# Patient Record
Sex: Female | Born: 1935 | Race: White | Hispanic: No | Smoking: Never smoker
Health system: Southern US, Community
[De-identification: ages and names within clinical notes are randomized; demographics above are authoritative.]

## PROBLEM LIST (undated history)

## (undated) DIAGNOSIS — N179 Acute kidney failure, unspecified: Secondary | ICD-10-CM

## (undated) DIAGNOSIS — J449 Chronic obstructive pulmonary disease, unspecified: Secondary | ICD-10-CM

## (undated) DIAGNOSIS — I509 Heart failure, unspecified: Secondary | ICD-10-CM

## (undated) DIAGNOSIS — I10 Essential (primary) hypertension: Secondary | ICD-10-CM

## (undated) DIAGNOSIS — M1990 Unspecified osteoarthritis, unspecified site: Secondary | ICD-10-CM

---

## 2019-06-26 ENCOUNTER — Encounter (HOSPITAL_COMMUNITY): Payer: Self-pay

## 2019-06-26 ENCOUNTER — Emergency Department (HOSPITAL_COMMUNITY): Payer: MEDICARE

## 2019-06-26 ENCOUNTER — Emergency Department (HOSPITAL_COMMUNITY)
Admission: EM | Admit: 2019-06-26 | Discharge: 2019-06-26 | Disposition: A | Discharge status: Home / Self Care | Payer: MEDICARE | Source: Home / Self Care | Attending: Emergency Medicine | Admitting: Emergency Medicine

## 2019-06-26 ENCOUNTER — Other Ambulatory Visit: Payer: Self-pay

## 2019-06-26 DIAGNOSIS — S0181XA Laceration without foreign body of other part of head, initial encounter: Secondary | ICD-10-CM

## 2019-06-26 DIAGNOSIS — Z23 Encounter for immunization: Secondary | ICD-10-CM | POA: Insufficient documentation

## 2019-06-26 DIAGNOSIS — Y939 Activity, unspecified: Secondary | ICD-10-CM | POA: Insufficient documentation

## 2019-06-26 DIAGNOSIS — S0990XA Unspecified injury of head, initial encounter: Secondary | ICD-10-CM | POA: Insufficient documentation

## 2019-06-26 DIAGNOSIS — J449 Chronic obstructive pulmonary disease, unspecified: Secondary | ICD-10-CM | POA: Insufficient documentation

## 2019-06-26 DIAGNOSIS — I110 Hypertensive heart disease with heart failure: Secondary | ICD-10-CM | POA: Insufficient documentation

## 2019-06-26 DIAGNOSIS — W010XXA Fall on same level from slipping, tripping and stumbling without subsequent striking against object, initial encounter: Secondary | ICD-10-CM | POA: Insufficient documentation

## 2019-06-26 DIAGNOSIS — Y999 Unspecified external cause status: Secondary | ICD-10-CM | POA: Insufficient documentation

## 2019-06-26 DIAGNOSIS — Y929 Unspecified place or not applicable: Secondary | ICD-10-CM | POA: Insufficient documentation

## 2019-06-26 DIAGNOSIS — I509 Heart failure, unspecified: Secondary | ICD-10-CM | POA: Insufficient documentation

## 2019-06-26 HISTORY — DX: Unspecified osteoarthritis, unspecified site: M19.90

## 2019-06-26 HISTORY — DX: Heart failure, unspecified: I50.9

## 2019-06-26 HISTORY — DX: Essential (primary) hypertension: I10

## 2019-06-26 HISTORY — DX: Chronic obstructive pulmonary disease, unspecified: J44.9

## 2019-06-26 MED ORDER — TETANUS-DIPHTH-ACELL PERTUSSIS 5-2.5-18.5 LF-MCG/0.5 IM SUSP
0.5000 mL | Freq: Once | INTRAMUSCULAR | Status: AC
  Administered 2019-06-26: 03:00:00 0.5 mL via INTRAMUSCULAR
  Filled 2019-06-26: qty 0.5

## 2019-06-26 MED ORDER — LIDOCAINE-EPINEPHRINE-TETRACAINE (LET) TOPICAL GEL
3.0000 mL | Freq: Once | TOPICAL | Status: AC
  Administered 2019-06-26: 3 mL via TOPICAL
  Filled 2019-06-26: qty 3

## 2019-06-26 NOTE — ED Triage Notes (Signed)
Per EMS, she walking and tripped and hit face on the floor. Per EMS, she denied loss of consciousness. Patient has no c/o pain tenderness to neck or back. Per EMS, she ambulated on seen with her walker. Per EMS she has large laceration and hematoma above left eye.

## 2019-06-26 NOTE — ED Notes (Signed)
Called patient's daughter, Nyoka Cowden, to pick up patient for discharge.

## 2019-06-26 NOTE — ED Provider Notes (Signed)
Emergency Department Provider Note   I have reviewed the triage vital signs and the nursing notes.   HISTORY  Chief Complaint Fall   HPI Leslie Sellers is a 85 y.o. female who presents the emergency department today after a fall.  Patient states that she tripped over something and hit her forehead on the floor and suffered a laceration above her left eyebrow.  No pain elsewhere.  She is able ambulate without difficulty with EMS.  No loss of consciousness.  no severe headache.   No injuries elsewhere.unknown tetanus. Thinks she is on blood thinners but not sure which one or for what reason.   No other associated or modifying symptoms.    Past Medical History:  Diagnosis Date  . Arthritis   . CHF (congestive heart failure) (HCC)   . COPD (chronic obstructive pulmonary disease) (HCC)   . Hypertension     There are no problems to display for this patient.   Allergies Patient has no allergy information on record.  No family history on file.  Social History Social History   Tobacco Use  . Smoking status: Never Smoker  . Smokeless tobacco: Never Used  Substance Use Topics  . Alcohol use: Never  . Drug use: Never    Review of Systems  All other systems negative except as documented in the HPI. All pertinent positives and negatives as reviewed in the HPI. ____________________________________________   PHYSICAL EXAM:  VITAL SIGNS: ED Triage Vitals  Enc Vitals Group     BP 06/26/19 0142 (!) 163/56     Pulse Rate 06/26/19 0128 64     Resp 06/26/19 0128 (!) 21     Temp 06/26/19 0128 (!) 97.5 F (36.4 C)     Temp Source 06/26/19 0128 Oral     SpO2 06/26/19 0122 96 %     Weight 06/26/19 0144 110 lb (49.9 kg)     Height 06/26/19 0144 5\' 2"  (1.575 m)    Constitutional: Alert and oriented. Well appearing and in no acute distress. Eyes: Conjunctivae are normal. PERRL. EOMI. Head: avulsion/laceration above left eyebrow with associated hematoma Nose: No  congestion/rhinnorhea. Mouth/Throat: Mucous membranes are moist.  Oropharynx non-erythematous. Neck: No stridor.  No meningeal signs.   Cardiovascular: Normal rate, regular rhythm. Good peripheral circulation. Grossly normal heart sounds.   Respiratory: Normal respiratory effort.  No retractions. Lungs CTAB. Gastrointestinal: Soft and nontender. No distention.  Musculoskeletal: No lower extremity tenderness nor edema. No gross deformities of extremities. Neurologic:  Normal speech and language. No gross focal neurologic deficits are appreciated.  Skin:  Skin is warm, dry and intact. No rash noted.   ____________________________________________   RADIOLOGY  CT Head Wo Contrast  Result Date: 06/26/2019 CLINICAL DATA:  Head trauma headache EXAM: CT HEAD WITHOUT CONTRAST TECHNIQUE: Contiguous axial images were obtained from the base of the skull through the vertex without intravenous contrast. COMPARISON:  None. FINDINGS: Brain: No acute territorial infarction, hemorrhage, or intracranial mass. Moderate atrophy. Moderate to marked hypodensity in the white matter consistent with chronic small vessel ischemic change. Slightly enlarged ventricles felt secondary to atrophy. Vascular: No hyperdense vessels.  Carotid vascular calcification Skull: Normal. Negative for fracture or focal lesion. Sinuses/Orbits: Moderate mucosal thickening in the left maxillary sinus Other: Moderate forehead hematoma IMPRESSION: 1. No CT evidence for acute intracranial abnormality. 2. Atrophy and chronic small vessel ischemic changes of the white matter 3. Moderate forehead scalp hematoma Electronically Signed   By: 06/28/2019 M.D.   On:  06/26/2019 03:37    ____________________________________________   PROCEDURES  Procedure(s) performed:   Marland KitchenMarland KitchenLaceration Repair  Date/Time: 06/26/2019 11:42 PM Performed by: Merrily Pew, MD Authorized by: Merrily Pew, MD   Consent:    Consent obtained:  Verbal   Consent  given by:  Patient   Risks discussed:  Infection, need for additional repair, nerve damage, poor wound healing, poor cosmetic result, pain, retained foreign body, tendon damage and vascular damage   Alternatives discussed:  No treatment, delayed treatment and observation Anesthesia (see MAR for exact dosages):    Anesthesia method:  Local infiltration and topical application   Topical anesthetic:  LET Laceration details:    Location:  Face   Face location:  Forehead   Length (cm):  3   Depth (mm):  5 Repair type:    Repair type:  Simple Pre-procedure details:    Preparation:  Patient was prepped and draped in usual sterile fashion and imaging obtained to evaluate for foreign bodies Exploration:    Wound exploration: wound explored through full range of motion and entire depth of wound probed and visualized   Treatment:    Area cleansed with:  Saline   Amount of cleaning:  Extensive   Irrigation solution:  Sterile water   Irrigation volume:  100   Irrigation method:  Syringe   Visualized foreign bodies/material removed: no   Skin repair:    Repair method:  Sutures   Suture size:  4-0   Suture material:  Fast-absorbing gut   Suture technique:  Simple interrupted   Number of sutures:  3 Approximation:    Approximation:  Loose Post-procedure details:    Patient tolerance of procedure:  Tolerated well, no immediate complications Comments:     Not able to fully approximate the wound without significant deformity of eyebrow secondary to avulsion of some tissue.    ____________________________________________   INITIAL IMPRESSION / ASSESSMENT AND PLAN / ED COURSE  mehcabnical fall, will ct.  tdap to be updated. Will attempt wound repair but not sure she doesn't have some missing tissue.  Wound not able to be fully approximated because of missing flesh but improved somewhat. Absorbable sutures placed. Dc.      Pertinent labs & imaging results that were available during my  care of the patient were reviewed by me and considered in my medical decision making (see chart for details).  A medical screening exam was performed and I feel the patient has had an appropriate workup for their chief complaint at this time and likelihood of emergent condition existing is low. They have been counseled on decision, discharge, follow up and which symptoms necessitate immediate return to the emergency department. They or their family verbally stated understanding and agreement with plan and discharged in stable condition.   ____________________________________________  FINAL CLINICAL IMPRESSION(S) / ED DIAGNOSES  Final diagnoses:  Laceration of forehead, initial encounter     MEDICATIONS GIVEN DURING THIS VISIT:  Medications  Tdap (BOOSTRIX) injection 0.5 mL (0.5 mLs Intramuscular Given 06/26/19 0249)  lidocaine-EPINEPHrine-tetracaine (LET) topical gel (3 mLs Topical Given 06/26/19 0253)     NEW OUTPATIENT MEDICATIONS STARTED DURING THIS VISIT:  There are no discharge medications for this patient.   Note:  This note was prepared with assistance of Dragon voice recognition software. Occasional wrong-word or sound-a-like substitutions may have occurred due to the inherent limitations of voice recognition software.   Merrily Pew, MD 06/26/19 [phone]

## 2019-06-26 NOTE — Discharge Instructions (Addendum)
Your wound appeared to have some avulsed skin and thus I was not able to close it as close as I would have liked.  Secondary to the swelling and the missing skin of I had closed it all the way you would had a permanent deformity to your left eyebrow.  I was able to repair to a certain extent which should help some with cosmesis.  However it will take a little bit longer for it to heal secondary to my inability to fully repair it.  The sutures should come out in 7 to 10 days if not you may return here to get them removed.  If you do not like the appearance of the wound after it heals in 6 to 12 months you can see a plastic surgeon for consideration of revision.

## 2019-06-26 NOTE — ED Notes (Signed)
Patient's wound starting bleeding again. Cleansed wound with NS and dressing with nonadhesive dressing, gauze and kerlix.

## 2019-06-27 ENCOUNTER — Inpatient Hospital Stay (HOSPITAL_COMMUNITY)
Admission: EM | Admit: 2019-06-27 | Discharge: 2019-06-29 | DRG: 392 | Disposition: A | Discharge status: Home Health | Payer: MEDICARE | Attending: Family Medicine | Admitting: Family Medicine

## 2019-06-27 ENCOUNTER — Encounter (HOSPITAL_COMMUNITY): Payer: Self-pay | Admitting: Emergency Medicine

## 2019-06-27 ENCOUNTER — Emergency Department (HOSPITAL_COMMUNITY): Payer: MEDICARE

## 2019-06-27 ENCOUNTER — Other Ambulatory Visit: Payer: Self-pay

## 2019-06-27 DIAGNOSIS — Y92009 Unspecified place in unspecified non-institutional (private) residence as the place of occurrence of the external cause: Secondary | ICD-10-CM

## 2019-06-27 DIAGNOSIS — E860 Dehydration: Secondary | ICD-10-CM

## 2019-06-27 DIAGNOSIS — Z23 Encounter for immunization: Secondary | ICD-10-CM

## 2019-06-27 DIAGNOSIS — Z20822 Contact with and (suspected) exposure to covid-19: Secondary | ICD-10-CM | POA: Diagnosis present

## 2019-06-27 DIAGNOSIS — N189 Chronic kidney disease, unspecified: Secondary | ICD-10-CM | POA: Diagnosis present

## 2019-06-27 DIAGNOSIS — S8001XA Contusion of right knee, initial encounter: Secondary | ICD-10-CM | POA: Diagnosis present

## 2019-06-27 DIAGNOSIS — Z9181 History of falling: Secondary | ICD-10-CM

## 2019-06-27 DIAGNOSIS — I2510 Atherosclerotic heart disease of native coronary artery without angina pectoris: Secondary | ICD-10-CM | POA: Diagnosis present

## 2019-06-27 DIAGNOSIS — K828 Other specified diseases of gallbladder: Secondary | ICD-10-CM | POA: Diagnosis present

## 2019-06-27 DIAGNOSIS — K219 Gastro-esophageal reflux disease without esophagitis: Secondary | ICD-10-CM | POA: Diagnosis present

## 2019-06-27 DIAGNOSIS — A09 Infectious gastroenteritis and colitis, unspecified: Secondary | ICD-10-CM | POA: Diagnosis not present

## 2019-06-27 DIAGNOSIS — I130 Hypertensive heart and chronic kidney disease with heart failure and stage 1 through stage 4 chronic kidney disease, or unspecified chronic kidney disease: Secondary | ICD-10-CM | POA: Diagnosis present

## 2019-06-27 DIAGNOSIS — Z7902 Long term (current) use of antithrombotics/antiplatelets: Secondary | ICD-10-CM

## 2019-06-27 DIAGNOSIS — N179 Acute kidney failure, unspecified: Secondary | ICD-10-CM

## 2019-06-27 DIAGNOSIS — Z79899 Other long term (current) drug therapy: Secondary | ICD-10-CM

## 2019-06-27 DIAGNOSIS — I509 Heart failure, unspecified: Secondary | ICD-10-CM | POA: Diagnosis present

## 2019-06-27 DIAGNOSIS — E872 Acidosis: Secondary | ICD-10-CM | POA: Diagnosis present

## 2019-06-27 DIAGNOSIS — M1711 Unilateral primary osteoarthritis, right knee: Secondary | ICD-10-CM | POA: Diagnosis present

## 2019-06-27 DIAGNOSIS — K529 Noninfective gastroenteritis and colitis, unspecified: Secondary | ICD-10-CM | POA: Diagnosis present

## 2019-06-27 DIAGNOSIS — N83201 Unspecified ovarian cyst, right side: Secondary | ICD-10-CM | POA: Diagnosis present

## 2019-06-27 DIAGNOSIS — F329 Major depressive disorder, single episode, unspecified: Secondary | ICD-10-CM | POA: Diagnosis present

## 2019-06-27 DIAGNOSIS — I745 Embolism and thrombosis of iliac artery: Secondary | ICD-10-CM | POA: Diagnosis present

## 2019-06-27 DIAGNOSIS — Z87891 Personal history of nicotine dependence: Secondary | ICD-10-CM

## 2019-06-27 DIAGNOSIS — S0181XA Laceration without foreign body of other part of head, initial encounter: Secondary | ICD-10-CM | POA: Diagnosis present

## 2019-06-27 DIAGNOSIS — W19XXXA Unspecified fall, initial encounter: Secondary | ICD-10-CM

## 2019-06-27 DIAGNOSIS — W010XXA Fall on same level from slipping, tripping and stumbling without subsequent striking against object, initial encounter: Secondary | ICD-10-CM | POA: Diagnosis present

## 2019-06-27 DIAGNOSIS — E785 Hyperlipidemia, unspecified: Secondary | ICD-10-CM | POA: Diagnosis present

## 2019-06-27 DIAGNOSIS — J449 Chronic obstructive pulmonary disease, unspecified: Secondary | ICD-10-CM | POA: Diagnosis present

## 2019-06-27 HISTORY — DX: Acute kidney failure, unspecified: N17.9

## 2019-06-27 LAB — CBC WITH DIFFERENTIAL/PLATELET
Abs Immature Granulocytes: 0.09 10*3/uL — ABNORMAL HIGH (ref 0.00–0.07)
Basophils Absolute: 0 10*3/uL (ref 0.0–0.1)
Basophils Relative: 0 %
Eosinophils Absolute: 0.1 10*3/uL (ref 0.0–0.5)
Eosinophils Relative: 1 %
HCT: 36.1 % (ref 36.0–46.0)
Hemoglobin: 11.4 g/dL — ABNORMAL LOW (ref 12.0–15.0)
Immature Granulocytes: 1 %
Lymphocytes Relative: 9 %
Lymphs Abs: 1 10*3/uL (ref 0.7–4.0)
MCH: 31.1 pg (ref 26.0–34.0)
MCHC: 31.6 g/dL (ref 30.0–36.0)
MCV: 98.4 fL (ref 80.0–100.0)
Monocytes Absolute: 0.7 10*3/uL (ref 0.1–1.0)
Monocytes Relative: 6 %
Neutro Abs: 8.5 10*3/uL — ABNORMAL HIGH (ref 1.7–7.7)
Neutrophils Relative %: 83 %
Platelets: 216 10*3/uL (ref 150–400)
RBC: 3.67 MIL/uL — ABNORMAL LOW (ref 3.87–5.11)
RDW: 13.6 % (ref 11.5–15.5)
WBC: 10.3 10*3/uL (ref 4.0–10.5)
nRBC: 0 % (ref 0.0–0.2)

## 2019-06-27 LAB — I-STAT CHEM 8, ED
BUN: 30 mg/dL — ABNORMAL HIGH (ref 8–23)
Calcium, Ion: 1.03 mmol/L — ABNORMAL LOW (ref 1.15–1.40)
Chloride: 108 mmol/L (ref 98–111)
Creatinine, Ser: 1.6 mg/dL — ABNORMAL HIGH (ref 0.44–1.00)
Glucose, Bld: 128 mg/dL — ABNORMAL HIGH (ref 70–99)
HCT: 35 % — ABNORMAL LOW (ref 36.0–46.0)
Hemoglobin: 11.9 g/dL — ABNORMAL LOW (ref 12.0–15.0)
Potassium: 4.3 mmol/L (ref 3.5–5.1)
Sodium: 142 mmol/L (ref 135–145)
TCO2: 24 mmol/L (ref 22–32)

## 2019-06-27 LAB — COMPREHENSIVE METABOLIC PANEL
ALT: 12 U/L (ref 0–44)
AST: 19 U/L (ref 15–41)
Albumin: 3.4 g/dL — ABNORMAL LOW (ref 3.5–5.0)
Alkaline Phosphatase: 60 U/L (ref 38–126)
Anion gap: 14 (ref 5–15)
BUN: 28 mg/dL — ABNORMAL HIGH (ref 8–23)
CO2: 21 mmol/L — ABNORMAL LOW (ref 22–32)
Calcium: 8.3 mg/dL — ABNORMAL LOW (ref 8.9–10.3)
Chloride: 106 mmol/L (ref 98–111)
Creatinine, Ser: 1.55 mg/dL — ABNORMAL HIGH (ref 0.44–1.00)
GFR calc Af Amer: 34 mL/min — ABNORMAL LOW (ref >60)
GFR calc non Af Amer: 29 mL/min — ABNORMAL LOW (ref >60)
Glucose, Bld: 133 mg/dL — ABNORMAL HIGH (ref 70–99)
Potassium: 4.4 mmol/L (ref 3.5–5.1)
Sodium: 141 mmol/L (ref 135–145)
Total Bilirubin: 0.6 mg/dL (ref 0.3–1.2)
Total Protein: 6 g/dL — ABNORMAL LOW (ref 6.5–8.1)

## 2019-06-27 LAB — LACTIC ACID, PLASMA
Lactic Acid, Venous: 3.3 mmol/L (ref 0.5–1.9)
Lactic Acid, Venous: 0.8 mmol/L (ref 0.5–1.9)

## 2019-06-27 LAB — MAGNESIUM: Magnesium: 1.9 mg/dL (ref 1.7–2.4)

## 2019-06-27 LAB — POC SARS CORONAVIRUS 2 AG -  ED: SARS Coronavirus 2 Ag: NEGATIVE

## 2019-06-27 MED ORDER — LORAZEPAM 0.5 MG PO TABS
0.5000 mg | ORAL_TABLET | Freq: Two times a day (BID) | ORAL | Status: DC | PRN
  Administered 2019-06-28 (×3): 0.5 mg via ORAL
  Filled 2019-06-27 (×3): qty 1

## 2019-06-27 MED ORDER — PANTOPRAZOLE SODIUM 40 MG PO TBEC
40.0000 mg | DELAYED_RELEASE_TABLET | Freq: Every day | ORAL | Status: DC
  Administered 2019-06-28 (×2): 40 mg via ORAL
  Filled 2019-06-27 (×2): qty 1

## 2019-06-27 MED ORDER — ONDANSETRON HCL 4 MG/2ML IJ SOLN: 4.0000 mg | Freq: Four times a day (QID) | INTRAMUSCULAR | Status: DC | PRN

## 2019-06-27 MED ORDER — SODIUM CHLORIDE 0.9 % IV SOLN: INTRAVENOUS | Status: DC

## 2019-06-27 MED ORDER — CITALOPRAM HYDROBROMIDE 20 MG PO TABS
10.0000 mg | ORAL_TABLET | Freq: Every day | ORAL | Status: DC
  Administered 2019-06-28 – 2019-06-29 (×2): 10 mg via ORAL
  Filled 2019-06-27 (×2): qty 1

## 2019-06-27 MED ORDER — METRONIDAZOLE IN NACL 5-0.79 MG/ML-% IV SOLN
500.0000 mg | Freq: Three times a day (TID) | INTRAVENOUS | Status: DC
  Administered 2019-06-27 – 2019-06-28 (×2): 500 mg via INTRAVENOUS
  Filled 2019-06-27 (×2): qty 100

## 2019-06-27 MED ORDER — SODIUM CHLORIDE 0.9 % IV BOLUS
1000.0000 mL | Freq: Once | INTRAVENOUS | Status: AC
  Administered 2019-06-27: 19:00:00 1000 mL via INTRAVENOUS

## 2019-06-27 MED ORDER — BACITRACIN-NEOMYCIN-POLYMYXIN OINTMENT TUBE: 1.0000 "application " | TOPICAL_OINTMENT | Freq: Two times a day (BID) | CUTANEOUS | Status: DC | PRN

## 2019-06-27 MED ORDER — ENOXAPARIN SODIUM 30 MG/0.3ML ~~LOC~~ SOLN
30.0000 mg | Freq: Every day | SUBCUTANEOUS | Status: DC
  Administered 2019-06-28 – 2019-06-29 (×2): 30 mg via SUBCUTANEOUS
  Filled 2019-06-27 (×2): qty 0.3

## 2019-06-27 MED ORDER — CLOPIDOGREL BISULFATE 75 MG PO TABS
75.0000 mg | ORAL_TABLET | Freq: Every day | ORAL | Status: DC
  Administered 2019-06-28 – 2019-06-29 (×2): 75 mg via ORAL
  Filled 2019-06-27 (×2): qty 1

## 2019-06-27 MED ORDER — ONDANSETRON HCL 4 MG PO TABS: 4.0000 mg | ORAL_TABLET | Freq: Four times a day (QID) | ORAL | Status: DC | PRN

## 2019-06-27 MED ORDER — SODIUM CHLORIDE 0.9 % IV SOLN
2.0000 g | INTRAVENOUS | Status: DC
  Administered 2019-06-27: 22:00:00 2 g via INTRAVENOUS
  Filled 2019-06-27 (×2): qty 20

## 2019-06-27 MED ORDER — ACETAMINOPHEN 325 MG PO TABS
650.0000 mg | ORAL_TABLET | Freq: Four times a day (QID) | ORAL | Status: DC | PRN
  Administered 2019-06-28 – 2019-06-29 (×2): 650 mg via ORAL
  Filled 2019-06-27 (×2): qty 2

## 2019-06-27 MED ORDER — PRAVASTATIN SODIUM 40 MG PO TABS
40.0000 mg | ORAL_TABLET | Freq: Every day | ORAL | Status: DC
  Administered 2019-06-28 (×2): 40 mg via ORAL
  Filled 2019-06-27 (×2): qty 1

## 2019-06-27 NOTE — ED Provider Notes (Signed)
[facility] EMERGENCY DEPARTMENT Provider Note   CSN: [id number] Arrival date & time: 06/27/19  1652     History Chief Complaint  Patient presents with  . Diarrhea  . Hip Pain    Leslie Sellers is a 86 y.o. female.  HPI  86 year old female presents with vomiting and diarrhea.  She fell and was seen in the ER couple days ago.  Started having vomiting and diarrhea today or yesterday.  She denies fever.  Has had a little bit of a dry cough this morning.  No shortness of breath, chest pain.  She does have abdominal soreness/pain.  No blood in the emesis or diarrhea.  She denies being on antibiotics recently.  She wonders if this could be from having a tetanus immunization a couple days ago.  Patient also notes that her legs were numb and painful to move earlier though this seems to have resolved now.  No numbness or pain now.  Past Medical History:  Diagnosis Date  . Arthritis   . CHF (congestive heart failure) ([place])   . COPD (chronic obstructive pulmonary disease) ([place])   . Hypertension     There are no problems to display for this patient.   History reviewed. No pertinent surgical history.   OB History   No obstetric history on file.     No family history on file.  Social History   Tobacco Use  . Smoking status: Never Smoker  . Smokeless tobacco: Never Used  Substance Use Topics  . Alcohol use: Never  . Drug use: Never    Home Medications Prior to Admission medications   Not on File    Allergies    Patient has no allergy information on record.  Review of Systems   Review of Systems  Constitutional: Negative for fever.  Respiratory: Positive for cough. Negative for shortness of breath.   Cardiovascular: Negative for chest pain.  Gastrointestinal: Positive for abdominal pain, diarrhea and vomiting. Negative for blood in stool.  Neurological: Positive for light-headedness.  All other systems reviewed and are negative.   Physical  Exam Updated Vital Signs BP (!) 79/31   Pulse 62   Temp 98 F (36.7 C) (Oral)   Resp 20   SpO2 94%   Physical Exam Vitals and nursing note reviewed.  Constitutional:      General: She is not in acute distress.    Appearance: She is well-developed. She is not diaphoretic.  HENT:     Head: Normocephalic.     Comments: Multiple areas of ecchymosis to face    Right Ear: External ear normal.     Left Ear: External ear normal.     Nose: Nose normal.  Eyes:     General:        Right eye: No discharge.        Left eye: No discharge.  Cardiovascular:     Rate and Rhythm: Normal rate and regular rhythm.     Heart sounds: Normal heart sounds.  Pulmonary:     Effort: Pulmonary effort is normal.     Breath sounds: Normal breath sounds.  Abdominal:     Palpations: Abdomen is soft.     Tenderness: There is generalized abdominal tenderness (mild, nonfocal).  Musculoskeletal:     Right hip: No deformity or tenderness. Normal range of motion.     Left hip: No deformity or tenderness. Normal range of motion.  Skin:    General: Skin is warm and dry.  Neurological:     Mental Status: She is alert.  Psychiatric:        Mood and Affect: Mood is not anxious.     ED Results / Procedures / Treatments   Labs (all labs ordered are listed, but only abnormal results are displayed) Labs Reviewed  CULTURE, BLOOD (ROUTINE X 2)  CULTURE, BLOOD (ROUTINE X 2)  URINE CULTURE  C DIFFICILE QUICK SCREEN W PCR REFLEX  GI PATHOGEN PANEL BY PCR, STOOL  COMPREHENSIVE METABOLIC PANEL  CBC WITH DIFFERENTIAL/PLATELET  LACTIC ACID, PLASMA  LACTIC ACID, PLASMA  URINALYSIS, ROUTINE W REFLEX MICROSCOPIC  MAGNESIUM  I-STAT CHEM 8, ED  POC SARS CORONAVIRUS 2 AG -  ED    EKG None  Radiology CT Head Wo Contrast  Result Date: 06/26/2019 CLINICAL DATA:  Head trauma headache EXAM: CT HEAD WITHOUT CONTRAST TECHNIQUE: Contiguous axial images were obtained from the base of the skull through the vertex  without intravenous contrast. COMPARISON:  None. FINDINGS: Brain: No acute territorial infarction, hemorrhage, or intracranial mass. Moderate atrophy. Moderate to marked hypodensity in the white matter consistent with chronic small vessel ischemic change. Slightly enlarged ventricles felt secondary to atrophy. Vascular: No hyperdense vessels.  Carotid vascular calcification Skull: Normal. Negative for fracture or focal lesion. Sinuses/Orbits: Moderate mucosal thickening in the left maxillary sinus Other: Moderate forehead hematoma IMPRESSION: 1. No CT evidence for acute intracranial abnormality. 2. Atrophy and chronic small vessel ischemic changes of the white matter 3. Moderate forehead scalp hematoma Electronically Signed   By: Jasmine Pang M.D.   On: 06/26/2019 03:37    Procedures .Critical Care Performed by: Pricilla Loveless, MD Authorized by: Pricilla Loveless, MD   Critical care provider statement:    Critical care time (minutes):  35   Critical care time was exclusive of:  Separately billable procedures and treating other patients   Critical care was necessary to treat or prevent imminent or life-threatening deterioration of the following conditions:  Shock, sepsis and renal failure   Critical care was time spent personally by me on the following activities:  Discussions with consultants, evaluation of patient's response to treatment, examination of patient, ordering and performing treatments and interventions, ordering and review of laboratory studies, ordering and review of radiographic studies, pulse oximetry, re-evaluation of patient's condition, obtaining history from patient or surrogate and review of old charts   (including critical care time)  Medications Ordered in ED Medications  sodium chloride 0.9 % bolus 1,000 mL (has no administration in time range)    ED Course  I have reviewed the triage vital signs and the nursing notes.  Pertinent labs & imaging results that were  available during my care of the patient were reviewed by me and considered in my medical decision making (see chart for details).  Clinical Course as of Jun 27 1743  Fri Jun 27, 2019  1743 Patient's blood pressure is soft.  She is afebrile.  My suspicion is based on the volume of diarrhea reported by both nurse and patient that this is likely dehydration.  We will certainly look for infectious causes/sources but this seems less likely to be sepsis.  Hold off on antibiotics for now.   [SG]    Clinical Course User Index [SG] Pricilla Loveless, MD   MDM Rules/Calculators/A&P                      Patient's hypotension improved with IV fluids.  She does have a lactic acidosis, probably from  dehydration.  With the potentially infectious colitis, however, will give IV antibiotics.  Pharmacy recommending cephalosporin with Flagyl.  Will test for GI pathogens and C. difficile.  Will need admission for further fluids and supportive care. Final Clinical Impression(s) / ED Diagnoses Final diagnoses:  Acute kidney injury (HCC)  Infectious colitis    Rx / DC Orders ED Discharge Orders    None       Pricilla Loveless, MD 06/28/19 [phone]

## 2019-06-27 NOTE — H&P (Addendum)
Family Medicine Teaching [facility] Admission History and Physical Service Pager: [phone]  Patient name: Leslie Sellers Medical record number: [id number] Date of birth: Aug 28, 1925 Age: 86 y.o. Gender: female  Primary Care Provider: Marletta Lor, NP Consultants: None Code Status: FULL Preferred Emergency Contact: daughter Di Kindle at [phone]  Chief Complaint: Abdominal pain, diarrhea  Assessment and Plan: Leslie Sellers is a 86 y.o. female presenting with vomiting and diarrhea. PMH is significant for CHF, COPD, HTN, arthritis, depression.  Abdominal Pain  Diarrhea  Dehydration 86 year old lady presenting with vomiting and diarrhea for 1 day.  Covid negative, mild anemia to 11.9, white blood cell count within normal limits at 10.3, however ANC elevated to 8.5.  Of note lactic acid is 3.3.  Blood pressures were on the softer side at 80s/50s, in combination with volume of vomiting and diarrhea, concern for dehydration was high.  Patient resuscitated with 1L NS, started on CTX and flagyl in ED, blood cultures obtained.  Blood pressure normalized with IV hydration.  CT abdomen revealed gallbladder distention with wall thickening, mild diffuse wall thickening of transverse and descending colon indicating possible infectious or inflammatory colitis, extensive atherosclerotic changes indicating possible occlusion in bilateral common iliac arteries, sigmoid diverticulosis without diverticulitis, 3.7 right ovarian cystic mass.  Acute cholecystitis unlikely due to negative comfortable appearance and negative Murphy's sign.  Radiology recommended nonemergent outpatient CT of abdominal aorta, as well as follow-up with a nonemergent outpatient pelvic ultrasound in 6 months for the ovarian mass. -Admit to FPTS, MedSurg, attending Dr. Leveda Anna -AM CBC, BMP, LA -Follow-up blood culture -Continue CTX, Flagyl, may be able to stop abx if patient is well appearing tomorrow -NS @ mIVF -Follow-up C. difficile  PCR result and GI pathogen panel -Enteric precautions  Recent fall Evaluated in the [facility] ED on 12/31 for a fall in which she hit her head.  CT head was negative and she received sutures.  She says her legs felt "numb" and like they were asleep, which is intermittent and lead to her fall.  She is not currently having the pain in her right leg and back.  Patient's daughter says that she is supposed to use a walker, however when she fell on Wednesday, she was not using the walker.  Patient has had previous stints in inpatient rehab, most recently moved here 1 month ago from a nursing home in [place].  CT abdomen pelvis reveals likely bilateral common iliac arteries are occluded, recommend CTA on nonemergent outpatient basis. - PT/OT evaluation -Follow-up CTA for abdominal aortic occlusions  AKI vs CKD Creatinine is 1.60 today.  Unsure what patient's baseline kidney function is, but she does report having a stent placed in her kidney in the past.  She has had recent poor PO intake and significant GI loss, which would make an AKI due to prerenal cause likely. -BMP daily -Avoid nephrotoxic agents  CHF, likely CAD Patient recently moved here from [place], got patient's medical history from daughter, unknown EF.  Daughter reported history of multiple attempts at stenting heart arteries, however these attempts were "unsuccessful."  Home medications include Plavix 75 mg, Imdur 30 mg, Coreg 12.5 mg twice daily.  Continue Plavix, hold Imdur and Coreg for hypotension.  Patient does not appear fluid overloaded on exam, and CXR negative for cardiomegaly or pulmonary edema. -Watch for fluid overload -Continue Plavix  HTN Home medications include amlodipine 5, lisinopril 40 mg. -Hold home medications for hypotension  Polypharmacy Patient has a list of beers drugs that are inappropriate  for geriatric patient, including chlorpheniramine and Ativan.  Patient has a longstanding use of Ativan, daughter notes that  while living in [place] and on her own, patient would sometimes take more Ativan than is prescribed.  Daughter has been attempting to help wean her mother off Ativan.  Patient is very adamant that she receive her "nerve pills".  Current dose is 0.5 mg in the morning and 1 mg in the evening.  -Ativan 0.5 mg twice daily PRN, recommended to patient's daughter to continue weaning as able -Recommend discontinuing chlorpheniramine  HLD Home medications include pravastatin 40 mg daily -Continue home medications  GERD Medications include pantoprazole 40 mg daily -Continue pantoprazole  Depression Patient has a history of depression and home medications include Celexa. -Continue Celexa  COPD Daughter reports history of COPD.  Patient is not currently on any COPD medications.  She confirms she smoked for about a decade 30 years ago.  FEN/GI: Regular diet, pantoprazole Prophylaxis: Lovenox  Disposition: to med-surg pending further medical work up  History of Present Illness:  Leslie Sellers is a 86 y.o. female presenting with diarrhea and vomiting of one day's duration.  She has been unable to drink or eat anything at home over the past 36 hours due to her symptoms.  She denies sick contacts.  She reports having a half of a hamburger, orange slices, and a few cookies for lunch yesterday and says the hamburger was raw inside.  She says she got hot for awhile yesterday but denies measured fever.  She vomited twice and has had 11-12 episodes of watery diarrhea.  She says she has not urinated very much since her symptoms began.    Review Of Systems: Per HPI with the following additions:   Review of Systems  Constitutional: Negative for weight loss.  Respiratory: Positive for cough (dry). Negative for sputum production.   Cardiovascular: Negative for chest pain.    There are no problems to display for this patient.   Past Medical History: Past Medical History:  Diagnosis Date  . Arthritis   . CHF  (congestive heart failure) (HCC)   . COPD (chronic obstructive pulmonary disease) (HCC)   . Hypertension     Past Surgical History: History reviewed. No pertinent surgical history.  Social History: Social History   Tobacco Use  . Smoking status: Never Smoker  . Smokeless tobacco: Never Used  Substance Use Topics  . Alcohol use: Never  . Drug use: Never   Additional social history: Patient lives with her daughter, daughter's husband, and grandson.  She recently moved here from [place] about a month ago.  She was previously living in a nursing home.  She has a history of number of falls, but is otherwise generally healthy according to patient's daughter. Please also refer to relevant sections of EMR.  Family History: No family history on file.   Allergies and Medications: No Known Allergies No current facility-administered medications on file prior to encounter.   No current outpatient medications on file prior to encounter.    Objective: BP (!) 113/57   Pulse 67   Temp 98 F (36.7 C) (Oral)   Resp 11   SpO2 100%  Exam: General: Small, thin, elderly woman, pleasant, NAD, resting in bed Eyes: Anicteric sclerae ENTM: Moist mucous membranes Neck: Supple Cardiovascular: Regular rate and rhythm, no M/R/G Respiratory: CTAB, no increased work of breathing Gastrointestinal: Soft, mildly tender to palpation, nondistended, normoactive bowel sounds MSK: Ecchymosis present on left shin, right knee, left shoulder Derm:  Significant ecchymosis over both eyes and most of left forehead and face, healing lacerations on upper forehead of left scalp Neuro: No focal deficits Psych: Good mood, normal affect  Labs and Imaging: CBC BMET  Recent Labs  Lab 06/27/19 1805 06/27/19 1819  WBC 10.3  --   HGB 11.4* 11.9*  HCT 36.1 35.0*  PLT 216  --    Recent Labs  Lab 06/27/19 1805 06/27/19 1819  NA 141 142  K 4.4 4.3  CL 106 108  CO2 21*  --   BUN 28* 30*  CREATININE 1.55* 1.60*   GLUCOSE 133* 128*  CALCIUM 8.3*  --      EKG: ordered, EKG in room showed QT interval 422 WNL with sinus arrhythmia  CT ABDOMEN PELVIS WO CONTRAST  Result Date: 06/27/2019 CLINICAL DATA:  Nausea and vomiting. EXAM: CT ABDOMEN AND PELVIS WITHOUT CONTRAST TECHNIQUE: Multidetector CT imaging of the abdomen and pelvis was performed following the standard protocol without IV contrast. COMPARISON:  None. FINDINGS: Lower chest: There are emphysematous changes at the lung bases bilaterally with prominent pleuroparenchymal scarring and reticular airspace opacities.The heart size is normal. Hepatobiliary: The liver is normal. The gallbladder is distended. There is gallbladder wall thickening.There is no biliary ductal dilation. Pancreas: Normal contours without ductal dilatation. No peripancreatic fluid collection. Spleen: No splenic laceration or hematoma. Adrenals/Urinary Tract: --Adrenal glands: No adrenal hemorrhage. --Right kidney/ureter: No hydronephrosis or perinephric hematoma. --Left kidney/ureter: No hydronephrosis or perinephric hematoma. --Urinary bladder: The bladder is decompressed and therefore poorly evaluated Stomach/Bowel: --Stomach/Duodenum: There is a small hiatal hernia. --Small bowel: No dilatation or inflammation. --Colon: There is mild diffuse wall thickening involving the transverse colon and descending colon. There is sigmoid diverticulosis without CT evidence for diverticulitis. --Appendix: Normal. Vascular/Lymphatic: There are extensive atherosclerotic changes throughout the abdominal aorta. There may be significant stenosis involving the infrarenal abdominal aorta. An underlying occlusion cannot be excluded. There is likely high-grade stenosis if not occlusion of the bilateral common iliac arteries. --No retroperitoneal lymphadenopathy. --No mesenteric lymphadenopathy. --No pelvic or inguinal lymphadenopathy. Reproductive: There is a right ovarian cystic mass measuring approximately 3.7  cm in diameter. Other: No ascites or free air. The abdominal wall is normal. Musculoskeletal. No acute displaced fractures. IMPRESSION: 1. The gallbladder is distended and there is gallbladder wall thickening. If there is concern for acute cholecystitis, recommend further evaluation with right upper quadrant ultrasound. 2. Mild diffuse wall thickening of the transverse and descending colon may represent infectious or inflammatory colitis in the appropriate clinical setting. 3. Extensive atherosclerotic changes of the infrarenal abdominal aorta and bilateral common iliac arteries. An underlying stenosis of both is suspected and can be further evaluated with a nonemergent outpatient CT a as clinically indicated. 4. There is sigmoid diverticulosis without CT evidence for diverticulitis. 5. There is a 3.7 cm right ovarian cystic mass. Follow-up with a nonemergent outpatient pelvic ultrasound is recommended in 6 months. Aortic Atherosclerosis (ICD10-I70.0) and Emphysema (ICD10-J43.9). Electronically Signed   By: Katherine Mantle M.D.   On: 06/27/2019 19:42   DG Chest Portable 1 View  Result Date: 06/27/2019 CLINICAL DATA:  Cough and hypotension EXAM: PORTABLE CHEST 1 VIEW COMPARISON:  None. FINDINGS: Cardiac shadows within normal limits. The lungs are well aerated bilaterally. No focal infiltrate or sizable effusion is seen. Degenerative changes of left shoulder joint are noted. No acute bony abnormality is noted. IMPRESSION: No active disease. Electronically Signed   By: Alcide Clever M.D.   On: 06/27/2019 18:25    Mahoney,  Urban Gibson, MD 06/27/2019, 9:30 PM PGY-1, [place] Intern pager: [phone], text pages welcome  FPTS Upper-Level Resident Addendum   I have independently interviewed and examined the patient. I have discussed the above with the original author and agree with their documentation. My edits for correction/addition/clarification are in blue. Please see also any attending  notes.    Kathrene Alu, MD PGY-3, [facility] 06/27/2019 11:16 PM  FPTS Service pager: [phone] (text pages welcome through [place])

## 2019-06-27 NOTE — ED Notes (Signed)
Date and time results received: 06/27/19  Test: Lactic Acid Critical Value: 3.3  Name of Provider Notified: Criss Alvine

## 2019-06-27 NOTE — ED Triage Notes (Signed)
Per [facility] pt coming from home where she lives with daughter. Reports fall 2 days ago and now having left hip pain. Patient c/o N/V/D onset of today. Patient is soiled in triage. 4mg  zofran and 100CC NS given PTA. Patient alert and orientated x4. Bruising to face and arms.

## 2019-06-27 NOTE — ED Notes (Signed)
Patient transported to CT 

## 2019-06-28 ENCOUNTER — Encounter (HOSPITAL_COMMUNITY): Payer: Self-pay | Admitting: Family Medicine

## 2019-06-28 ENCOUNTER — Observation Stay (HOSPITAL_COMMUNITY): Payer: MEDICARE

## 2019-06-28 DIAGNOSIS — M1711 Unilateral primary osteoarthritis, right knee: Secondary | ICD-10-CM | POA: Diagnosis present

## 2019-06-28 DIAGNOSIS — Z79899 Other long term (current) drug therapy: Secondary | ICD-10-CM | POA: Diagnosis not present

## 2019-06-28 DIAGNOSIS — I2510 Atherosclerotic heart disease of native coronary artery without angina pectoris: Secondary | ICD-10-CM | POA: Diagnosis present

## 2019-06-28 DIAGNOSIS — K219 Gastro-esophageal reflux disease without esophagitis: Secondary | ICD-10-CM | POA: Diagnosis present

## 2019-06-28 DIAGNOSIS — Z20822 Contact with and (suspected) exposure to covid-19: Secondary | ICD-10-CM | POA: Diagnosis present

## 2019-06-28 DIAGNOSIS — S8001XA Contusion of right knee, initial encounter: Secondary | ICD-10-CM | POA: Diagnosis present

## 2019-06-28 DIAGNOSIS — E872 Acidosis: Secondary | ICD-10-CM | POA: Diagnosis present

## 2019-06-28 DIAGNOSIS — I509 Heart failure, unspecified: Secondary | ICD-10-CM | POA: Diagnosis present

## 2019-06-28 DIAGNOSIS — K828 Other specified diseases of gallbladder: Secondary | ICD-10-CM | POA: Diagnosis present

## 2019-06-28 DIAGNOSIS — Z23 Encounter for immunization: Secondary | ICD-10-CM | POA: Diagnosis present

## 2019-06-28 DIAGNOSIS — E860 Dehydration: Secondary | ICD-10-CM | POA: Diagnosis present

## 2019-06-28 DIAGNOSIS — A09 Infectious gastroenteritis and colitis, unspecified: Secondary | ICD-10-CM | POA: Diagnosis present

## 2019-06-28 DIAGNOSIS — Z9181 History of falling: Secondary | ICD-10-CM | POA: Diagnosis not present

## 2019-06-28 DIAGNOSIS — N189 Chronic kidney disease, unspecified: Secondary | ICD-10-CM | POA: Diagnosis present

## 2019-06-28 DIAGNOSIS — J449 Chronic obstructive pulmonary disease, unspecified: Secondary | ICD-10-CM | POA: Diagnosis present

## 2019-06-28 DIAGNOSIS — Y92009 Unspecified place in unspecified non-institutional (private) residence as the place of occurrence of the external cause: Secondary | ICD-10-CM | POA: Diagnosis not present

## 2019-06-28 DIAGNOSIS — F329 Major depressive disorder, single episode, unspecified: Secondary | ICD-10-CM | POA: Diagnosis present

## 2019-06-28 DIAGNOSIS — E785 Hyperlipidemia, unspecified: Secondary | ICD-10-CM | POA: Diagnosis present

## 2019-06-28 DIAGNOSIS — N179 Acute kidney failure, unspecified: Secondary | ICD-10-CM

## 2019-06-28 DIAGNOSIS — W19XXXA Unspecified fall, initial encounter: Secondary | ICD-10-CM

## 2019-06-28 DIAGNOSIS — S0181XA Laceration without foreign body of other part of head, initial encounter: Secondary | ICD-10-CM | POA: Diagnosis present

## 2019-06-28 DIAGNOSIS — W010XXA Fall on same level from slipping, tripping and stumbling without subsequent striking against object, initial encounter: Secondary | ICD-10-CM | POA: Diagnosis not present

## 2019-06-28 DIAGNOSIS — Z7902 Long term (current) use of antithrombotics/antiplatelets: Secondary | ICD-10-CM | POA: Diagnosis not present

## 2019-06-28 DIAGNOSIS — I130 Hypertensive heart and chronic kidney disease with heart failure and stage 1 through stage 4 chronic kidney disease, or unspecified chronic kidney disease: Secondary | ICD-10-CM | POA: Diagnosis present

## 2019-06-28 DIAGNOSIS — I745 Embolism and thrombosis of iliac artery: Secondary | ICD-10-CM | POA: Diagnosis present

## 2019-06-28 DIAGNOSIS — Z87891 Personal history of nicotine dependence: Secondary | ICD-10-CM | POA: Diagnosis not present

## 2019-06-28 DIAGNOSIS — N83201 Unspecified ovarian cyst, right side: Secondary | ICD-10-CM | POA: Diagnosis present

## 2019-06-28 LAB — CBC
HCT: 29.5 % — ABNORMAL LOW (ref 36.0–46.0)
Hemoglobin: 9.5 g/dL — ABNORMAL LOW (ref 12.0–15.0)
MCH: 31.4 pg (ref 26.0–34.0)
MCHC: 32.2 g/dL (ref 30.0–36.0)
MCV: 97.4 fL (ref 80.0–100.0)
Platelets: 183 10*3/uL (ref 150–400)
RBC: 3.03 MIL/uL — ABNORMAL LOW (ref 3.87–5.11)
RDW: 13.4 % (ref 11.5–15.5)
WBC: 8.9 10*3/uL (ref 4.0–10.5)
nRBC: 0.2 % (ref 0.0–0.2)

## 2019-06-28 LAB — BASIC METABOLIC PANEL
Anion gap: 12 (ref 5–15)
BUN: 29 mg/dL — ABNORMAL HIGH (ref 8–23)
CO2: 22 mmol/L (ref 22–32)
Calcium: 8 mg/dL — ABNORMAL LOW (ref 8.9–10.3)
Chloride: 108 mmol/L (ref 98–111)
Creatinine, Ser: 1.36 mg/dL — ABNORMAL HIGH (ref 0.44–1.00)
GFR calc Af Amer: 39 mL/min — ABNORMAL LOW (ref >60)
GFR calc non Af Amer: 34 mL/min — ABNORMAL LOW (ref >60)
Glucose, Bld: 119 mg/dL — ABNORMAL HIGH (ref 70–99)
Potassium: 4.4 mmol/L (ref 3.5–5.1)
Sodium: 142 mmol/L (ref 135–145)

## 2019-06-28 LAB — SARS CORONAVIRUS 2 (TAT 6-24 HRS): SARS Coronavirus 2: NEGATIVE

## 2019-06-28 NOTE — NC FL2 (Signed)
[place] MEDICAID FL2 LEVEL OF CARE SCREENING TOOL     IDENTIFICATION  Patient Name: Leslie Sellers Birthdate: 1925/09/02 Sex: female Admission Date (Current Location): 06/27/2019  [facility] and [place] Number:  Producer, television/film/video and Address:  [facility], [address] [place], [place] [address]      Provider Number: [id number]  Attending Physician Name and Address:  Moses Manners, MD  Relative Name and Phone Number:       Current Level of Care: Hospital Recommended Level of Care: Skilled Nursing Facility Prior Approval Number:    Date Approved/Denied:   PASRR Number: [id number]  Discharge Plan: SNF    Current Diagnoses: Patient Active Problem List   Diagnosis Date Noted  . Acute kidney injury (HCC)   . Fall   . Infectious colitis   . Dehydration   . Gastroenteritis 06/27/2019    Orientation RESPIRATION BLADDER Height & Weight     Self, Place  Normal Continent Weight:   Height:     BEHAVIORAL SYMPTOMS/MOOD NEUROLOGICAL BOWEL NUTRITION STATUS      Continent Diet(see discharge summary)  AMBULATORY STATUS COMMUNICATION OF NEEDS Skin   Limited Assist Verbally Normal                       Personal Care Assistance Level of Assistance  Bathing, Feeding, Dressing Bathing Assistance: Limited assistance Feeding assistance: Independent Dressing Assistance: Limited assistance     Functional Limitations Info  Sight, Hearing, Speech Sight Info: Adequate Hearing Info: Adequate Speech Info: Adequate    SPECIAL CARE FACTORS FREQUENCY  OT (By licensed OT), PT (By licensed PT)     PT Frequency: 5x week OT Frequency: 5x week            Contractures Contractures Info: Not present    Additional Factors Info  Code Status, Allergies, Isolation Precautions, Psychotropic Code Status Info: Full Code Allergies Info: No Known Allergies Psychotropic Info: citalopram (CELEXA) tablet 10 mg daily PO   Isolation Precautions Info:  Enteric Precautions     Current Medications (06/28/2019):  This is the current hospital active medication list Current Facility-Administered Medications  Medication Dose Route Frequency Provider Last Rate Last Admin  . acetaminophen (TYLENOL) tablet 650 mg  650 mg Oral Q6H PRN Lennox Solders, MD   650 mg at 06/28/19 0321  . citalopram (CELEXA) tablet 10 mg  10 mg Oral Daily Lennox Solders, MD   10 mg at 06/28/19 1043  . clopidogrel (PLAVIX) tablet 75 mg  75 mg Oral Daily Lennox Solders, MD   75 mg at 06/28/19 1043  . enoxaparin (LOVENOX) injection 30 mg  30 mg Subcutaneous Daily Lennox Solders, MD   30 mg at 06/28/19 1048  . LORazepam (ATIVAN) tablet 0.5 mg  0.5 mg Oral BID PRN Lennox Solders, MD   0.5 mg at 06/28/19 1538  . neomycin-bacitracin-polymyxin (NEOSPORIN) ointment 1 application  1 application Topical BID PRN Lennox Solders, MD      . ondansetron (ZOFRAN) tablet 4 mg  4 mg Oral Q6H PRN Lennox Solders, MD       Or  . ondansetron (ZOFRAN) injection 4 mg  4 mg Intravenous Q6H PRN Winfrey, Harlen Labs, MD      . pantoprazole (PROTONIX) EC tablet 40 mg  40 mg Oral QHS Lennox Solders, MD   40 mg at 06/28/19 0011  . pravastatin (PRAVACHOL) tablet 40 mg  40 mg Oral  QHS Kathrene Alu, MD   40 mg at 06/28/19 0011     Discharge Medications: Please see discharge summary for a list of discharge medications.  Relevant Imaging Results:  Relevant Lab Results:   Additional Information SS# [place], [place]

## 2019-06-28 NOTE — Evaluation (Signed)
Occupational Therapy Evaluation Patient Details Name: Leslie Sellers MRN: [id number] DOB: September 14, 1925 Today's Date: 06/28/2019    History of Present Illness Leslie Sellers is a 86 y.o. female presenting with vomiting and diarrhea secondary to gastroenteritis, confirmed with CT findings. PMH is significant for CHF, COPD, HTN, arthritis, depression. recent fall 12/31 with right knee pain. CT head negative. R knee x-ray showing osteoarthritis but no acute findings.    Clinical Impression   PTA, pt was living with her daughter and performed BADLs; daughter performing all IADLs. Pt currently performing ADLs and functional mobility at [facility] A level with RW. Pt presenting with decreased balance and activity tolerance. Pt also verbalizing increased fear of falling. Pt would benefit from further acute OT to facilitate safe dc. Recommend dc to home with 24/7 support and HHOT for further OT to optimize safety, independence with ADLs, and return to PLOF.      Follow Up Recommendations  Home health OT;Supervision/Assistance - 24 hour    Equipment Recommendations  None recommended by OT    Recommendations for Other Services PT consult     Precautions / Restrictions Precautions Precautions: Fall Restrictions Weight Bearing Restrictions: No      Mobility Bed Mobility Overal bed mobility: Needs Assistance Bed Mobility: Supine to Sit     Supine to sit: Supervision     General bed mobility comments: No physical A needed  Transfers Overall transfer level: Needs assistance Equipment used: Rolling walker (2 wheeled) Transfers: Sit to/from Stand Sit to Stand: Min guard         General transfer comment: Min Guard A for safety    Balance Overall balance assessment: Needs assistance Sitting-balance support: Feet supported Sitting balance-Leahy Scale: Good     Standing balance support: Bilateral upper extremity supported Standing balance-Leahy Scale: Fair Standing balance comment: Able to  perform peri care and oral care without UE support                           ADL either performed or assessed with clinical judgement   ADL Overall ADL's : Needs assistance/impaired Eating/Feeding: Set up;Sitting   Grooming: Oral care;Wash/dry hands;Min guard;Standing Grooming Details (indicate cue type and reason): Min Guard A for safety Upper Body Bathing: Set up;Supervision/ safety;Sitting   Lower Body Bathing: Min guard;Sit to/from stand   Upper Body Dressing : Set up;Supervision/safety;Sitting   Lower Body Dressing: Min guard;Sit to/from stand   Toilet Transfer: Min guard;Ambulation;RW;BSC Toilet Transfer Details (indicate cue type and reason): Min Guard A for safety  Toileting- Clothing Manipulation and Hygiene: Min guard;Sit to/from stand Toileting - Clothing Manipulation Details (indicate cue type and reason): Min Guard A for safety in standing     Functional mobility during ADLs: Min guard;Rolling walker General ADL Comments: Pt presenting with decreased strength adn balabce, Feel she is close to baseline function. Fearful of falls     Vision         Perception     Praxis      Pertinent Vitals/Pain Pain Assessment: Faces Faces Pain Scale: Hurts little more Pain Location: back, bilateral shoulders Pain Descriptors / Indicators: Sore Pain Intervention(s): Monitored during session;Limited activity within patient's tolerance;Repositioned     Hand Dominance Right   Extremity/Trunk Assessment Upper Extremity Assessment Upper Extremity Assessment: Generalized weakness   Lower Extremity Assessment Lower Extremity Assessment: Generalized weakness   Cervical / Trunk Assessment Cervical / Trunk Assessment: Kyphotic   Communication Communication Communication: No difficulties  Cognition Arousal/Alertness: Awake/alert Behavior During Therapy: WFL for tasks assessed/performed Overall Cognitive Status: No family/caregiver present to determine  baseline cognitive functioning                                 General Comments: Pt able to confirm that she fell and is not at the hospital. Following cues and sequencing BADLs.    General Comments       Exercises     Shoulder Instructions      Home Living Family/patient expects to be discharged to:: Private residence Living Arrangements: Children(daughter, son in law, grandson) Available Help at Discharge: Family;Available 24 hours/day Type of Home: House Home Access: Stairs to enter [employer] of Steps: 3   Home Layout: One level               Home Equipment: Walker - 2 wheels;Walker - 4 wheels;Wheelchair - manual;Bedside commode          Prior Functioning/Environment Level of Independence: Needs assistance  Gait / Transfers Assistance Needed: uses walker, son in law helps her up and down the steps ADL's / Homemaking Assistance Needed: sponge bathes, pt daughter assists with all IADL's            OT Problem List: Decreased strength;Decreased range of motion;Decreased activity tolerance;Impaired balance (sitting and/or standing);Decreased knowledge of use of DME or AE;Decreased knowledge of precautions      OT Treatment/Interventions: Self-care/ADL training;Therapeutic exercise;Energy conservation;DME and/or AE instruction;Therapeutic activities;Patient/family education    OT Goals(Current goals can be found in the care plan section) Acute Rehab OT Goals Patient Stated Goal: "Never fall again" OT Goal Formulation: With patient Time For Goal Achievement: 07/12/19 Potential to Achieve Goals: Good  OT Frequency: Min 2X/week   Barriers to D/C:            Co-evaluation              AM-PAC OT "6 Clicks" Daily Activity     Outcome Measure Help from another person eating meals?: None Help from another person taking care of personal grooming?: A Little Help from another person toileting, which includes using toliet, bedpan, or  urinal?: A Little Help from another person bathing (including washing, rinsing, drying)?: A Little Help from another person to put on and taking off regular upper body clothing?: None Help from another person to put on and taking off regular lower body clothing?: A Little 6 Click Score: 20   End of Session Equipment Utilized During Treatment: Rolling walker;Oxygen(2L) Nurse Communication: Mobility status  Activity Tolerance: Patient tolerated treatment well Patient left: in bed;with call bell/phone within reach  OT Visit Diagnosis: Unsteadiness on feet (R26.81);Other abnormalities of gait and mobility (R26.89);Muscle weakness (generalized) (M62.81);Other symptoms and signs involving cognitive function                Time: [id number] OT Time Calculation (min): 19 min Charges:  OT General Charges $OT Visit: 1 Visit OT Evaluation $OT Eval Moderate Complexity: [place], OTR/L Acute Rehab Pager: [phone] Office: [place] 06/28/2019, 3:50 PM

## 2019-06-28 NOTE — Discharge Summary (Signed)
Family Medicine Teaching [facility] Discharge Summary  Patient name: Leslie Sellers Medical record number: [id number] Date of birth: 05-23-1926 Age: 86 y.o. Gender: female Date of Admission: 06/27/2019  Date of Discharge: 06/29/2019 Admitting Physician: Lennox Solders, MD  Primary Care Provider: Marletta Lor, NP Consultants: PT/OT  Indication for Hospitalization: dehydration   Discharge Diagnoses/Problem List:  Abdominal pain Diarrhea Dehydration Right knee pain  Recent fall AKI vs. CKD CHF, likely CAD HTN Polypharmacy HLD GERD Depression COPD  Disposition: home  Discharge Condition: improving  Discharge Exam:  General: Awake and alert, ambulating with assistance to bedside commode Cardiovascular: RRR, no MRG Respiratory: CTAB, comfortable work of breathing on 2 L O2 Abdomen: Soft, nontender, bowel sounds normal Extremities: no edema Skin: multiple bruises throughout, most prominent on face, but appears stable   Brief Hospital Course:  Leslie Sellers is a 86 y.o. female presenting for dehydration after diarrhea 2/2 gastroenteritis. PMH is significant forCHF, COPD, HTN, arthritis, depression.  Diarrhea/dehydration Patient initially presenting.  Having several episodes of diarrhea at home.  CT abdomen showed gallbladder distention as well as wall thickening, mild diffuse wall thickening of transverse and descending colon indicating possible infectious or inflammatory colitis.  There is also significant atherosclerotic changes.  Patient did eat a "raw" hamburger which was pink in the middle which is a likely source.  Patient was started on ceftriaxone and Flagyl in the ED.  Patient also received fluid bolus as well as maintenance IV fluids.  Lactic acid was initially elevated to 3.3 but resolved to 1.8 after fluid hydration.  Given patient's improvement antibiotics were discontinued on 1/2 as well as discontinuing of IV fluids as patient was tolerating p.o.  Unable to obtain C.  difficile PCR as well as GI pathogen panel as patient had no diarrheal episodes in the hospital.  On discharge patient continued to have no diarrhea.  Right knee pain & recent fall Patient had recent ED visit on 12/31 for fall.  Has significant bruising throughout her face as well as repair laceration.  Patient also reported right knee pain.  Knee x-ray was done showing arthritic changes however no acute findings.  PT and OT were consulted given recent fall and recommended home health PT and OT.    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  Recommended CTA as a nonemergent outpatient basis.  Need to follow this up as an outpatient.  AKI Thought to have AKI given creatinine of 1.6 on admission.  Unclear what baseline was.  Improved to 1.02 on discharge.  Polypharmacy Patient with multiple medications are on beers list.  These included chlor phentermine and Ativan.  Patient's daughter has been trying to wean her from Ativan.  We weaned Ativan to 0.5 mg twice daily as needed.  Would consider continuing to wean so that patient is off of medications that are on beers list.  Issues for Follow Up:  1. Improved diarrhea and dehydration 2. F/u CTA for common iliac artery occlusion 3. F/u of ovarian cystic mass - recommended for pelvic US in 6 months  4. Wean Ativan 5. Continued therapy to reduce risk of falls.  Significant Procedures:  None  Significant Labs and Imaging:  Recent Labs  Lab 06/27/19 1805 06/27/19 1819 06/28/19 0004 06/29/19 0122  WBC 10.3  --  8.9 4.8  HGB 11.4* 11.9* 9.5* 9.0*  HCT 36.1 35.0* 29.5* 28.3*  PLT 216  --  183 156   Recent Labs  Lab 06/27/19  1805 06/27/19 1819 06/28/19 0004 06/29/19 0122  NA 141 142 142 143  K 4.4 4.3 4.4 3.3*  CL 106 108 108 107  CO2 21*  --  22 26  GLUCOSE 133* 128* 119* 89  BUN 28* 30* 29* 13  CREATININE 1.55* 1.60* 1.36* 1.02*  CALCIUM 8.3*  --  8.0* 8.1*  MG 1.9  --    --   --   ALKPHOS 60  --   --   --   AST 19  --   --   --   ALT 12  --   --   --   ALBUMIN 3.4*  --   --   --     CT ABDOMEN PELVIS WO CONTRAST  Result Date: 06/27/2019 CLINICAL DATA:  Nausea and vomiting. EXAM: CT ABDOMEN AND PELVIS WITHOUT CONTRAST TECHNIQUE: Multidetector CT imaging of the abdomen and pelvis was performed following the standard protocol without IV contrast. COMPARISON:  None. FINDINGS: Lower chest: There are emphysematous changes at the lung bases bilaterally with prominent pleuroparenchymal scarring and reticular airspace opacities.The heart size is normal. Hepatobiliary: The liver is normal. The gallbladder is distended. There is gallbladder wall thickening.There is no biliary ductal dilation. Pancreas: Normal contours without ductal dilatation. No peripancreatic fluid collection. Spleen: No splenic laceration or hematoma. Adrenals/Urinary Tract: --Adrenal glands: No adrenal hemorrhage. --Right kidney/ureter: No hydronephrosis or perinephric hematoma. --Left kidney/ureter: No hydronephrosis or perinephric hematoma. --Urinary bladder: The bladder is decompressed and therefore poorly evaluated Stomach/Bowel: --Stomach/Duodenum: There is a small hiatal hernia. --Small bowel: No dilatation or inflammation. --Colon: There is mild diffuse wall thickening involving the transverse colon and descending colon. There is sigmoid diverticulosis without CT evidence for diverticulitis. --Appendix: Normal. Vascular/Lymphatic: There are extensive atherosclerotic changes throughout the abdominal aorta. There may be significant stenosis involving the infrarenal abdominal aorta. An underlying occlusion cannot be excluded. There is likely high-grade stenosis if not occlusion of the bilateral common iliac arteries. --No retroperitoneal lymphadenopathy. --No mesenteric lymphadenopathy. --No pelvic or inguinal lymphadenopathy. Reproductive: There is a right ovarian cystic mass measuring approximately 3.7 cm in  diameter. Other: No ascites or free air. The abdominal wall is normal. Musculoskeletal. No acute displaced fractures. IMPRESSION: 1. The gallbladder is distended and there is gallbladder wall thickening. If there is concern for acute cholecystitis, recommend further evaluation with right upper quadrant ultrasound. 2. Mild diffuse wall thickening of the transverse and descending colon may represent infectious or inflammatory colitis in the appropriate clinical setting. 3. Extensive atherosclerotic changes of the infrarenal abdominal aorta and bilateral common iliac arteries. An underlying stenosis of both is suspected and can be further evaluated with a nonemergent outpatient CT a as clinically indicated. 4. There is sigmoid diverticulosis without CT evidence for diverticulitis. 5. There is a 3.7 cm right ovarian cystic mass. Follow-up with a nonemergent outpatient pelvic ultrasound is recommended in 6 months. Aortic Atherosclerosis (ICD10-I70.0) and Emphysema (ICD10-J43.9). Electronically Signed   By: Constance Holster M.D.   On: 06/27/2019 19:42   DG Knee 1-2 Views Right  Result Date: 06/28/2019 CLINICAL DATA:  Recent fall, pain and swelling EXAM: RIGHT KNEE - 1-2 VIEW COMPARISON:  None. FINDINGS: Moderate tricompartmental osteoarthritis with joint space loss, sclerosis and bony spurring. No acute osseous finding, fracture, malalignment or large joint effusion. Femoropopliteal peripheral calcific atherosclerosis noted. IMPRESSION: Right knee tricompartmental osteoarthritis. No acute finding by plain radiography Peripheral atherosclerosis Electronically Signed   By: Jerilynn Mages.  Shick M.D.   On: 06/28/2019 09:44   CT Head Wo  Contrast  Result Date: 06/26/2019 CLINICAL DATA:  Head trauma headache EXAM: CT HEAD WITHOUT CONTRAST TECHNIQUE: Contiguous axial images were obtained from the base of the skull through the vertex without intravenous contrast. COMPARISON:  None. FINDINGS: Brain: No acute territorial infarction,  hemorrhage, or intracranial mass. Moderate atrophy. Moderate to marked hypodensity in the white matter consistent with chronic small vessel ischemic change. Slightly enlarged ventricles felt secondary to atrophy. Vascular: No hyperdense vessels.  Carotid vascular calcification Skull: Normal. Negative for fracture or focal lesion. Sinuses/Orbits: Moderate mucosal thickening in the left maxillary sinus Other: Moderate forehead hematoma IMPRESSION: 1. No CT evidence for acute intracranial abnormality. 2. Atrophy and chronic small vessel ischemic changes of the white matter 3. Moderate forehead scalp hematoma Electronically Signed   By: Jasmine Pang M.D.   On: 06/26/2019 03:37   DG Chest Portable 1 View  Result Date: 06/27/2019 CLINICAL DATA:  Cough and hypotension EXAM: PORTABLE CHEST 1 VIEW COMPARISON:  None. FINDINGS: Cardiac shadows within normal limits. The lungs are well aerated bilaterally. No focal infiltrate or sizable effusion is seen. Degenerative changes of left shoulder joint are noted. No acute bony abnormality is noted. IMPRESSION: No active disease. Electronically Signed   By: Alcide Clever M.D.   On: 06/27/2019 18:25     Results/Tests Pending at Time of Discharge:  Unresulted Labs (From admission, onward)    Start     Ordered   06/27/19 1744  C difficile quick scan w PCR reflex  (C Difficile quick screen w PCR reflex panel)  Once, for 24 hours,   STAT     06/27/19 1743   06/27/19 1740  Urinalysis, Routine w reflex microscopic  ONCE - STAT,   STAT     06/27/19 1740   06/27/19 1740  Urine culture  ONCE - STAT,   STAT     06/27/19 1740           Discharge Medications:  Allergies as of 06/29/2019   No Known Allergies     Medication List    STOP taking these medications   chlorpheniramine 4 MG tablet Commonly known as: CHLOR-TRIMETON   SELENIUM PO     TAKE these medications   acetaminophen 500 MG tablet Commonly known as: TYLENOL Take 500 mg by mouth 2 (two) times  daily.   amLODipine 5 MG tablet Commonly known as: NORVASC Take 5 mg by mouth daily.   carvedilol 12.5 MG tablet Commonly known as: COREG Take 12.5 mg by mouth 2 (two) times daily with a meal.   citalopram 10 MG tablet Commonly known as: CELEXA Take 10 mg by mouth daily.   clopidogrel 75 MG tablet Commonly known as: PLAVIX Take 75 mg by mouth daily.   hydrALAZINE 50 MG tablet Commonly known as: APRESOLINE Take 50 mg by mouth 3 (three) times daily.   isosorbide dinitrate 30 MG tablet Commonly known as: ISORDIL Take 30 mg by mouth daily.   lisinopril 40 MG tablet Commonly known as: ZESTRIL Take 40 mg by mouth every evening.   LORazepam 0.5 MG tablet Commonly known as: ATIVAN Take 0.5-1 mg by mouth See admin instructions. Take one tablet (0.5 mg) by mouth every morning and 2 tablets (1 mg) at night   meclizine 12.5 MG tablet Commonly known as: ANTIVERT Take 12.5 mg by mouth 3 (three) times daily.   neomycin-bacitracin-polymyxin Oint Commonly known as: NEOSPORIN Apply 1 application topically 2 (two) times daily as needed for wound care.   pantoprazole 40 MG tablet  Commonly known as: PROTONIX Take 40 mg by mouth at bedtime.   pravastatin 40 MG tablet Commonly known as: PRAVACHOL Take 40 mg by mouth at bedtime.   Vitamin D-3 125 MCG (5000 UT) Tabs Take 5,000 Units by mouth 3 (three) times a week.       Discharge Instructions: Please refer to Patient Instructions section of EMR for full details.  Patient was counseled important signs and symptoms that should prompt return to medical care, changes in medications, dietary instructions, activity restrictions, and follow up appointments.   Follow-Up Appointments:   Lennox Solders, MD 06/29/2019, 6:54 AM PGY-3, [facility] Family Medicine

## 2019-06-28 NOTE — Progress Notes (Addendum)
Family Medicine Teaching Service Daily Progress Note Intern Pager: [phone]  Patient name: Leslie Sellers Medical record number: [id number] Date of birth: 12/16/1925 Age: 86 y.o. Gender: female  Primary Care Provider: Alvester Chou, NP Consultants: None Code Status: Full   Pt Overview and Major Events to Date:  Admitted to [facility] 1/1  Assessment and Plan: Leslie Sellers is a 86 y.o. female presenting with vomiting and diarrhea. PMH is significant for CHF, COPD, HTN, arthritis, depression.  Abdominal Pain  Diarrhea  Dehydration VSS. No charted output. 2/2 Gastroenteritis, confirmed with CT findings. Today reports she feels improved.  Denies any further diarrhea episodes.  States she knows something is wrong and is happy to be on antibiotics now.. LA downtrended from 3.3>0.8. C diff & GI panel pending, patient with no further bowel movements so unable to collect sample.  -Follow-up blood culture -discontinue CTX, Flagyl (1/1-), can consider transition to PO abx -discontinue NS @ 100cc/h as patient tolerating PO -F/u C. difficile PCR result and GI pathogen panel -Enteric precautions  Right knee pain after recent fall Reports continued right knee pain after recent fall.  Reports that she had knee pain prior to fall but after fall it is significantly worse.  CT head was negative. Otherwise well appearing.  Knee exam essentially within normal limits but did have a large bruise over the patella.  Knee x-ray this morning showing osteoarthritis but no acute findings. -PT/OT evaluation -Follow-up CTA for abdominal aortic occlusions  AKI vs CKD Cr improved 1.6>1.36, unclear baseline. Likely prerenal 2/2 dehydration  -BMP daily -Avoid nephrotoxic agents  CHF, likely CAD Unknown EF.  Home medications include Plavix 75 mg, Imdur 30 mg, Coreg 12.5 mg twice daily. Euvolemic on exam. No charted output -Continue Plavix -hold Imdur and Coreg for hypotension  -Watch for fluid overload -Continue  Plavix -strict Is and Os -daily weights  HTN Stable. BP 158/40. Home medications include amlodipine 5, lisinopril 40 mg. -consider restarting amlodipine given hypertension  -hold lisinopril during AKI  Polypharmacy Home meds including chlorpheniramine and ativan. Multiple medications on Beer's List.  -Ativan 0.5 mg twice daily PRN, recommended to patient's daughter to continue weaning as able -Recommend discontinuing chlorpheniramine  HLD Home medications include pravastatin 40 mg daily -Continue home medications  GERD Medications include pantoprazole 40 mg daily -Continue pantoprazole  Depression Patient has a history of depression and home medications include Celexa. -Continue Celexa  COPD Home 2L O2. Unclear why, but patient placed on 5L ON. Currently on 2.5L, RN to wean to 2L. No charted desats  -continue to wean until home O2 tolerated  FEN/GI: Regular diet, pantoprazole Prophylaxis: Lovenox  Disposition: pending PT recs, can likely dc if PT approves dc home  Subjective:  Patient today reports improvement.  Only complaint today is her right knee pain.  States that she had knee pain prior to fall but recently after fall it is worse.  Objective: Temp:  [98 F (36.7 C)-98.4 F (36.9 C)] 98.4 F (36.9 C) (01/02 0621) Pulse Rate:  [58-99] 64 (01/02 0621) Resp:  [11-23] 19 (01/02 0621) BP: (79-184)/(31-62) 158/40 (01/02 0621) SpO2:  [94 %-100 %] 100 % (01/02 7341) Physical Exam: General: awake and alert, NAD, laying in bed Cardiovascular: RRR, no MRG  Respiratory: CTAB, no wheezes, rales or rhonchi Abdomen: soft, some diffuse tenderness, non distended, bowel sounds present  Extremities: no edema Knee: full ROM, non tender, bruising over patella Skin: multiple bruises throughout, most prominent on face, but appears stable   Laboratory: Recent Labs  Lab 06/27/19 1805 06/27/19 1819 06/28/19 0004  WBC 10.3  --  8.9  HGB 11.4* 11.9* 9.5*  HCT 36.1  35.0* 29.5*  PLT 216  --  183   Recent Labs  Lab 06/27/19 1805 06/27/19 1819 06/28/19 0004  NA 141 142 142  K 4.4 4.3 4.4  CL 106 108 108  CO2 21*  --  22  BUN 28* 30* 29*  CREATININE 1.55* 1.60* 1.36*  CALCIUM 8.3*  --  8.0*  PROT 6.0*  --   --   BILITOT 0.6  --   --   ALKPHOS 60  --   --   ALT 12  --   --   AST 19  --   --   GLUCOSE 133* 128* 119*     Imaging/Diagnostic Tests: No new images   Oralia Manis, DO 06/28/2019, 11:34 AM PGY-3, [facility] FPTS Intern pager: [phone], text pages welcome

## 2019-06-28 NOTE — TOC Initial Note (Signed)
Transition of Care [facility]) - Initial/Assessment Note    Patient Details  Name: Elizebeth Kluesner MRN: [id number] Date of Birth: 1926/02/13  [facility] CM/SW Contact:    Alexander Mt, [place] Phone Number: 06/28/2019, 2:08 PM  Clinical Narrative:                 CSW spoke with pt at bedside. Introduced self, role, reason for call. Pt from home with daughter and son in law. She was moved down from [place] before thanksgiving. Confirmed PCP and home address. Pt has a rollator at home as well as a modified bathroom close to her room. Since her fall she has required more assistance than is her baseline and since pt children work during the day they feel rehab to return home stronger would be best.   Pt daughter states she was in nursing facility in [place] getting therapy and they had "extended her stay there under Medicare." Per daughter report pt has Medicare and an [employer] supplement. We discussed that it appears from report pt was under her 100 day coverage under Medicare/supplement. If pt needed to go to SNF again, as long as it's medically necessary, there are coverage days left and pt continues to make progress Medicare and supplement may still cover a stay.   Since pt has not had a 60 day wellness period pt daughter I discussed that we would need for a SNF to assess coverage days left for pt. At this time pt walking 40 ft min assist. Will send pt referral out to SNFs for assessment and offers. Pt currently observation but Medicare waiver still in place.   TOC team will f/u with pt to discuss plan further.  Expected Discharge Plan: Skilled Nursing Facility Barriers to Discharge: Continued Medical Work up, Ship broker   Patient Goals and CMS Choice Patient states their goals for this hospitalization and ongoing recovery are:: for her to be able to rehab after her fall. CMS Medicare.gov Compare Post Acute Care list provided to:: Patient Represenative (must comment)(pt daughter  Nyoka Cowden) Choice offered to / list presented to : Adult Children  Expected Discharge Plan and Services Expected Discharge Plan: [place] In-house Referral: Clinical Social Work Discharge Planning Services: CM Consult Post Acute Care Choice: [place] Living arrangements for the past 2 months: [place]  Prior Living Arrangements/Services Living arrangements for the past 2 months: Single Family Home Lives with:: Adult Children, Relatives Patient language and need for interpreter reviewed:: Yes(no needs) Do you feel safe going back to the place where you live?: Yes      Need for Family Participation in Patient Care: Yes (Comment) Care giver support system in place?: Yes (comment) Current home services: DME, Home OT, Home PT Criminal Activity/Legal Involvement Pertinent to Current Situation/Hospitalization: No - Comment as needed  Activities of Daily Living Home Assistive Devices/Equipment: Wheelchair, Environmental consultant (specify type) ADL Screening (condition at time of admission) Patient's cognitive ability adequate to safely complete daily activities?: Yes Is the patient deaf or have difficulty hearing?: Yes Does the patient have difficulty seeing, even when wearing glasses/contacts?: No Does the patient have difficulty concentrating, remembering, or making decisions?: No Patient able to express need for assistance with ADLs?: Yes Does the patient have difficulty dressing or bathing?: Yes Independently performs ADLs?: No Communication: Independent Dressing (OT): Needs assistance Is this a change from baseline?: Pre-admission baseline Grooming: Needs assistance Is this a change from baseline?: Pre-admission baseline Feeding: Independent Bathing: Needs assistance Is this  a change from baseline?: Pre-admission baseline Toileting: Needs assistance Is this a change from baseline?: Pre-admission baseline In/Out Bed: Needs assistance Is this a change from  baseline?: Pre-admission baseline Walks in Home: Independent with device (comment)(uses walker and wheelchair at home) Does the patient have difficulty walking or climbing stairs?: Yes Weakness of Legs: Both Weakness of Arms/Hands: Both  Permission Sought/Granted Permission sought to share information with : Family Supports Permission granted to share information with : No(pt with fluctuating orientation)  Share Information with NAME: Di Kindle  Permission granted to share info w AGENCY: SNFs  Permission granted to share info w Relationship: daughter  Permission granted to share info w Contact Information: [phone]  Emotional Assessment Appearance:: Other (Comment Required(telephonic assessment with daughter) Attitude/Demeanor/Rapport: (telephonic assessment with daughter) Affect (typically observed): (telephonic assessment with daughter) Orientation: : Oriented to Self, Fluctuating Orientation (Suspected and/or reported Sundowners) Alcohol / Substance Use: Not Applicable Psych Involvement: No (comment)  Admission diagnosis:  Gastroenteritis [K52.9] Patient Active Problem List   Diagnosis Date Noted  . Gastroenteritis 06/27/2019   PCP:  Marletta Lor, NP Pharmacy:   [id number] - [place], [place] - [address] AT [facility] [address] [place] [address] Phone: [phone] Fax: [phone]   Readmission Risk Interventions No flowsheet data found.

## 2019-06-28 NOTE — Progress Notes (Signed)
Had a long discussion with the daughter of pt, Di Kindle at [phone] about the plan of care.  She states that the pt had been in a nursing home in [place], had wanted to get out of that nursing home and come here and so they had brought her here in their home.  She had been using a walker but then had the fall and then the abd pain/diarrhea.  Dtr states that she feels she needs more rehab to be able to get back to using the walker.  She had been receiving HHPT and HHOT services [facility] prior to hospitalization.  Information shared with SW and Care Management.

## 2019-06-28 NOTE — Evaluation (Signed)
Physical Therapy Evaluation Patient Details Name: Leslie Sellers MRN: [id number] DOB: [birth year] Today's Date: 06/28/2019   History of Present Illness  Leslie Sellers is a 86 y.o. female presenting with vomiting and diarrhea secondary to gastroenteritis, confirmed with CT findings. PMH is significant for CHF, COPD, HTN, arthritis, depression. recent fall 12/31 with right knee pain. CT head negative. R knee x-ray showing osteoarthritis but no acute findings.   Clinical Impression  Pt admitted with above diagnosis. Prior to admission, pt lives with her daughter, is household ambulatory with a walker, and dependent with IADL's. On PT evaluation, pt A&Ox1, oriented to self only, but follows all commands and answers questions appropriately. Pt reports she is largely sedentary at home. Pt ambulating 40 feet with walker at a min guard assist level. Denies increased right knee pain with weightbearing but does report bilateral shoulder and back pain. In setting of recent fall and pt presenting as high fall risk, recommending HHPT at discharge to maximize functional independence and reduce caregiver burden.      Follow Up Recommendations Home health PT;Supervision/Assistance - 24 hour    Equipment Recommendations  None recommended by PT    Recommendations for Other Services       Precautions / Restrictions Precautions Precautions: Fall Restrictions Weight Bearing Restrictions: No      Mobility  Bed Mobility Overal bed mobility: Needs Assistance Bed Mobility: Supine to Sit     Supine to sit: Supervision        Transfers Overall transfer level: Needs assistance Equipment used: Rolling walker (2 wheeled) Transfers: Sit to/from Stand Sit to Stand: Min guard            Ambulation/Gait Ambulation/Gait assistance: Min guard Gait Distance (Feet): 40 Feet Assistive device: Rolling walker (2 wheeled) Gait Pattern/deviations: Step-through pattern;Decreased stride length Gait velocity:  decreased   General Gait Details: Pt ambulating with slow and steady pace at a min guard assist level for safety. No overt LOB  Stairs            Wheelchair Mobility    Modified Rankin (Stroke Patients Only)       Balance Overall balance assessment: Needs assistance Sitting-balance support: Feet supported Sitting balance-Leahy Scale: Good Sitting balance - Comments: able to don socks edge of bed without loss of balance   Standing balance support: Bilateral upper extremity supported Standing balance-Leahy Scale: Poor Standing balance comment: reliant on external support                             Pertinent Vitals/Pain Pain Assessment: Faces Faces Pain Scale: Hurts little more Pain Location: back, bilateral shoulders Pain Descriptors / Indicators: Sore Pain Intervention(s): Monitored during session    Home Living Family/patient expects to be discharged to:: Private residence Living Arrangements: Children(daughter, son in Social worker, grandson) Available Help at Discharge: Family;Available 24 hours/day Type of Home: House Home Access: Stairs to enter   [employer] of Steps: 3 Home Layout: One level Home Equipment: Walker - 2 wheels;Walker - 4 wheels;Wheelchair - manual      Prior Function Level of Independence: Needs assistance   Gait / Transfers Assistance Needed: uses walker, son in law helps her up and down the steps  ADL's / Homemaking Assistance Needed: sponge bathes, pt daughter assists with all IADL's        Hand Dominance   Dominant Hand: Right    Extremity/Trunk Assessment   Upper Extremity Assessment Upper Extremity Assessment: Generalized weakness  Lower Extremity Assessment Lower Extremity Assessment: Generalized weakness       Communication   Communication: No difficulties  Cognition Arousal/Alertness: Awake/alert Behavior During Therapy: WFL for tasks assessed/performed Overall Cognitive Status: No  family/caregiver present to determine baseline cognitive functioning                                 General Comments: Pt A&Ox1, not oriented to place or time, stating it was November 2020 and she knew she was in a hospital but couldn't state the name or city. When asked what city she was in, pt stating, "[place]." No family/caregiver present to determine baseline.      General Comments  On 2L O2    Exercises     Assessment/Plan    PT Assessment Patient needs continued PT services  PT Problem List Decreased strength;Decreased activity tolerance;Decreased mobility;Decreased balance;Decreased cognition       PT Treatment Interventions DME instruction;Gait training;Stair training;Functional mobility training;Therapeutic activities;Therapeutic exercise;Balance training;Patient/family education    PT Goals (Current goals can be found in the Care Plan section)  Acute Rehab PT Goals Patient Stated Goal: "talk to my daughter." PT Goal Formulation: With patient Time For Goal Achievement: 07/12/19 Potential to Achieve Goals: Good    Frequency Min 3X/week   Barriers to discharge        Co-evaluation               AM-PAC PT "6 Clicks" Mobility  Outcome Measure Help needed turning from your back to your side while in a flat bed without using bedrails?: None Help needed moving from lying on your back to sitting on the side of a flat bed without using bedrails?: None Help needed moving to and from a bed to a chair (including a wheelchair)?: A Little Help needed standing up from a chair using your arms (e.g., wheelchair or bedside chair)?: A Little Help needed to walk in hospital room?: A Little Help needed climbing 3-5 steps with a railing? : A Little 6 Click Score: 20    End of Session Equipment Utilized During Treatment: Gait belt Activity Tolerance: Patient tolerated treatment well Patient left: in chair;with call bell/phone within reach;with chair  alarm set Nurse Communication: Mobility status PT Visit Diagnosis: Unsteadiness on feet (R26.81);History of falling (Z91.81);Muscle weakness (generalized) (M62.81);Difficulty in walking, not elsewhere classified (R26.2)    Time: 1275-1700 PT Time Calculation (min) (ACUTE ONLY): 29 min   Charges:   PT Evaluation $PT Eval Moderate Complexity: 1 Mod PT Treatments $Therapeutic Activity: 8-22 mins        Ellamae Sia, PT, DPT Acute Rehabilitation Services Pager [phone] Office [phone]   Willy Eddy 06/28/2019, 1:15 PM

## 2019-06-28 NOTE — Accreditation Note (Signed)
Low bed obtained for pt since she has history of fall.  Pt assisted up to the [facility] and she voided a large amount of urine.  Checked sacral area for any pressure injury and skin is intact.  Placed preventative sacral foam.  Pt c/o knee pain, Dr. Truitt Merle and will obtain xray.

## 2019-06-29 LAB — CBC
HCT: 28.3 % — ABNORMAL LOW (ref 36.0–46.0)
Hemoglobin: 9 g/dL — ABNORMAL LOW (ref 12.0–15.0)
MCH: 30.8 pg (ref 26.0–34.0)
MCHC: 31.8 g/dL (ref 30.0–36.0)
MCV: 96.9 fL (ref 80.0–100.0)
Platelets: 156 10*3/uL (ref 150–400)
RBC: 2.92 MIL/uL — ABNORMAL LOW (ref 3.87–5.11)
RDW: 13.2 % (ref 11.5–15.5)
WBC: 4.8 10*3/uL (ref 4.0–10.5)
nRBC: 0 % (ref 0.0–0.2)

## 2019-06-29 LAB — BASIC METABOLIC PANEL
Anion gap: 10 (ref 5–15)
BUN: 13 mg/dL (ref 8–23)
CO2: 26 mmol/L (ref 22–32)
Calcium: 8.1 mg/dL — ABNORMAL LOW (ref 8.9–10.3)
Chloride: 107 mmol/L (ref 98–111)
Creatinine, Ser: 1.02 mg/dL — ABNORMAL HIGH (ref 0.44–1.00)
GFR calc Af Amer: 56 mL/min — ABNORMAL LOW (ref >60)
GFR calc non Af Amer: 48 mL/min — ABNORMAL LOW (ref >60)
Glucose, Bld: 89 mg/dL (ref 70–99)
Potassium: 3.3 mmol/L — ABNORMAL LOW (ref 3.5–5.1)
Sodium: 143 mmol/L (ref 135–145)

## 2019-06-29 MED ORDER — CARVEDILOL 12.5 MG PO TABS
12.5000 mg | ORAL_TABLET | Freq: Two times a day (BID) | ORAL | Status: DC
  Administered 2019-06-29: 08:00:00 12.5 mg via ORAL
  Filled 2019-06-29: qty 1

## 2019-06-29 MED ORDER — HYDRALAZINE HCL 50 MG PO TABS
50.0000 mg | ORAL_TABLET | Freq: Three times a day (TID) | ORAL | Status: DC
  Administered 2019-06-29: 08:00:00 50 mg via ORAL
  Filled 2019-06-29: qty 1

## 2019-06-29 MED ORDER — AMLODIPINE BESYLATE 5 MG PO TABS
5.0000 mg | ORAL_TABLET | Freq: Every day | ORAL | Status: DC
  Administered 2019-06-29: 08:00:00 5 mg via ORAL
  Filled 2019-06-29: qty 1

## 2019-06-29 NOTE — Progress Notes (Signed)
Discussed plan of care with dtr, [place]ie.  Family to pick up pt at 1200, bed will be delivered to their home.  Pt wants to go home.  DC instructions reviewed.

## 2019-06-29 NOTE — Progress Notes (Signed)
Pt taken downstairs in wheelchair w/ O2, family picked up and had O2 in car.  Reviewed home care and foam dressings pt has on.

## 2019-06-29 NOTE — Discharge Instructions (Signed)
You were admitted for dehydration due to diarrhea.  You had a kidney injury as well because of your dehydration.  We gave you antibiotics for your diarrhea as well as fluids for your dehydration.  Your kidney function improved while you were here, and your diarrhea subsided, so you were felt to be safe to go home today.  We also had you evaluated by Physical Therapy and Occupational Therapy, who recommended home health services.  These were ordered, so you should expect therapy to be coming to your home in the next few weeks.  You can restart almost all of your home medications, but we are stopping your selenium, and I highly advise that you only use meclizine and Ativan when needed since these medications are not safe in patients older than 84 years old.  We also stopped your allergy medicine because it can cause falls.  I would recommend using Zyrtec, Allegra, or Claritin over-the-counter instead.  It is also very important that you establish with a primary doctor soon.  You would benefit from management of your medications and several tests to make sure that you continue to be in good health.

## 2019-06-29 NOTE — TOC Transition Note (Addendum)
Transition of Care [facility]) - CM/SW Discharge Note   Patient Details  Name: Leslie Sellers MRN: [id number] Date of Birth: 02/15/1926  [facility]) CM/SW Contact:  Deveron Furlong, RN 06/29/2019, 11:28 AM   Clinical Narrative:    Patient to dc home with previous HH services through [facility]. Patient and family elect not to take bed offers from SNFs.   Patient and family request hospital bed. D/W daughter. Bed to be delivered today from [facility].  The patient can return home prior to bed delivery and will transport via private vehicle.    Final next level of care: Home w Home Health Services Barriers to Discharge: No Barriers Identified   Patient Goals and CMS Choice Patient states their goals for this hospitalization and ongoing recovery are:: to avoid falls CMS Medicare.gov Compare Post Acute Care list provided to:: Patient Choice offered to / list presented to : Adult Children    Discharge Plan and Services In-house Referral: Clinical Social Work Discharge Planning Services: CM Consult Post Acute Care Choice: Skilled Nursing Facility          DME Arranged: Hospital bed DME Agency: ([employer] [employer] Date DME Agency Contacted: 06/29/19 Time DME Agency Contacted: 1128 Representative spoke with at DME Agency: Greggory Stallion- delivery driver [place] HH Arranged: PT, OT HH Agency: [employer] [facility] Date [facility] Agency Contacted: 06/29/19 Time HH Agency Contacted: 1118 Representative spoke with at [facility] Agency: Becky Sax  The patient requires a hospital bed to be able to elevate the HOB in increments up to 90 degrees.  The patient requires that the [facility] be able to elevate and lower quickly.  The patient requires a bed that can be raised and lowered so that care can be provided.

## 2019-06-29 NOTE — Progress Notes (Signed)
Family Medicine Teaching Service Daily Progress Note Intern Pager: [phone]  Patient name: Leslie Sellers Medical record number: [id number] Date of birth: Jan 11, 1926 Age: 86 y.o. Gender: female  Primary Care Provider: Alvester Chou, NP Consultants: None Code Status: Full   Pt Overview and Major Events to Date:  Admitted to [facility] 1/1  Assessment and Plan: Dua Mehler is a 86 y.o. female presenting with vomiting and diarrhea. PMH is significant for CHF, COPD, HTN, arthritis, depression.  Abdominal Pain  Diarrhea  Dehydration No significant diarrhea during admission, unable to collect C diff and GI panel.  Today reports she feels improved and is urinating frequently.  Denies any further diarrhea episodes.  Antibiotics and fluids were discontinued on 1/2 due to patient improvement.  Blood culture without growth <24 hours, patient's vital signs are within normal limits.  Creatinine further improved from 1.60 on admission to 1.02 on 1/3. - discharge home today  Right knee pain after recent fall Reports continued right knee pain after recent fall.  Reports that she had knee pain prior to fall but after fall it is significantly worse.  CT head was negative. Otherwise well appearing.  Knee exam essentially within normal limits but did have a large bruise over the patella.  Knee x-ray this morning showing osteoarthritis but no acute findings. -PT home health per PT recommendations -Follow-up CTA outpatient for abdominal aortic occlusions  AKI vs CKD Improved.  Cr improved 1.6>1.02, unclear baseline. Likely prerenal 2/2 dehydration  -BMP daily -Avoid nephrotoxic agents  CHF, likely CAD Unknown EF.  Home medications include Plavix 75 mg, Imdur 30 mg, Coreg 12.5 mg twice daily. Euvolemic on exam. No charted output -Continue Plavix -hold Imdur and Coreg for hypotension  -Watch for fluid overload -Continue Plavix -strict Is and Os -daily weights  HTN Stable. BP 171/70 after hypertensive  medications were held due to AKI and hypotension. Home medications include amlodipine 5, lisinopril 40 mg. -consider restarting amlodipine given hypertension  -hold lisinopril during AKI -patient and daughter will be advised to establish with a PCP on discharge -restart amlodipine today and lisinopril tomorrow  Polypharmacy Home meds including chlorpheniramine and ativan. Multiple medications on [facility] List.  -Ativan 0.5 mg twice daily PRN, recommended to patient's daughter to continue weaning as able -Recommend discontinuing chlorpheniramine  HLD Home medications include pravastatin 40 mg daily -Continue home medications  GERD Medications include pantoprazole 40 mg daily -Continue pantoprazole  Depression Patient has a history of depression and home medications include Celexa. -Continue Celexa  COPD Home 2L O2.  On 2L currently.  No charted desats  -continue to wean until home O2 tolerated  FEN/GI: Regular diet, pantoprazole Prophylaxis: Lovenox  Disposition: home today  Subjective:  Patient denies any abdominal pain or diarrhea and says that she is peeing frequently.  Objective: Temp:  [98.4 F (36.9 C)] 98.4 F (36.9 C) (01/02 2040) Pulse Rate:  [64-79] 79 (01/02 2040) Resp:  [18-19] 19 (01/02 2040) BP: (158-198)/(40-70) 171/70 (01/02 2040) SpO2:  [98 %-100 %] 98 % (01/02 2040) Physical Exam: General: Awake and alert, ambulating with assistance to bedside commode Cardiovascular: RRR, no MRG Respiratory: CTAB, comfortable work of breathing on 2 L O2 Abdomen: Soft, nontender, bowel sounds normal Extremities: no edema Skin: multiple bruises throughout, most prominent on face, but appears stable   Laboratory: Recent Labs  Lab 06/27/19 1805 06/27/19 1819 06/28/19 0004 06/29/19 0122  WBC 10.3  --  8.9 4.8  HGB 11.4* 11.9* 9.5* 9.0*  HCT 36.1 35.0* 29.5* 28.3*  PLT 216  --  183 156   Recent Labs  Lab 06/27/19 1805 06/27/19 1819 06/28/19 0004  06/29/19 0122  NA 141 142 142 143  K 4.4 4.3 4.4 3.3*  CL 106 108 108 107  CO2 21*  --  22 26  BUN 28* 30* 29* 13  CREATININE 1.55* 1.60* 1.36* 1.02*  CALCIUM 8.3*  --  8.0* 8.1*  PROT 6.0*  --   --   --   BILITOT 0.6  --   --   --   ALKPHOS 60  --   --   --   ALT 12  --   --   --   AST 19  --   --   --   GLUCOSE 133* 128* 119* 89     Imaging/Diagnostic Tests: No new images   Lennox Solders, MD 06/29/2019, 5:35 AM PGY-3, [facility] FPTS Intern pager: [phone], text pages welcome

## 2019-06-29 NOTE — Progress Notes (Signed)
Talked to dtr and she is good with pt coming home and hospital bed.  They will pick her up at 1200.

## 2019-07-02 LAB — CULTURE, BLOOD (ROUTINE X 2)
Culture: NO GROWTH
Culture: NO GROWTH

## 2019-12-05 ENCOUNTER — Non-Acute Institutional Stay: Payer: MEDICARE | Admitting: Hospice

## 2019-12-05 ENCOUNTER — Other Ambulatory Visit: Payer: Self-pay

## 2019-12-05 DIAGNOSIS — I2510 Atherosclerotic heart disease of native coronary artery without angina pectoris: Secondary | ICD-10-CM

## 2019-12-05 DIAGNOSIS — Z515 Encounter for palliative care: Secondary | ICD-10-CM

## 2019-12-05 NOTE — Progress Notes (Signed)
Therapist, nutritional Palliative Care Consult Note Telephone: [phone]  Fax: [phone]  PATIENT NAME: Leslie Sellers DOB: 1926-02-13 MRN: [id number]  PRIMARY CARE PROVIDER:   Marletta Lor, NP  REFERRING PROVIDER: Dr. Marden Noble  RESPONSIBLE PARTY: Self/ Leslie Sellers - daughter [phone]    RECOMMENDATIONS/PLAN:   Advance Care Planning/Goals of Care: Visit at the request of Dr. Marden Noble for palliative consult. Visit consisted of building trust and discussions on Palliative Medicine as specialized medical care for people living with serious illness, aimed at facilitating better quality of life through symptoms relief, assisting with advance care plan and establishing goals of care.  In-person discussions with patient and on the telephone with Leslie Sellers on  counseling and education dealing with the complex and emotionally intense issues of symptom management and palliative care in the setting of serious and potentially life-threatening illness.  Patient and Leslie Sellers endorsed palliative services.  Also extensively discussed advance care planning and goals of care clarification.  Patient affirmed she is a DO NOT RESUSCITATE.  MOST form selections today include limited additional intervention, IV fluids as indicated, antibiotics as indicated, no feeding tube.  MOST form selections were also discussed with Leslie Sellers who was in agreement with patient's decisions.  NP signed DO NOT RESUSCITATE form and MOST form and handed them over to nurse Morrie Sheldon for facility records; same document uploaded to epic today.  Palliative care team will continue to support patient, patient's family, and medical team.  Goals of care include to maximize quality of life and symptom management.  Symptom management: Patient with ongoing weakness, shortness of breath on mild-to-moderate exertion.  She is currently walking with PT/OT; PT reported patient is doing well now able to walk with her little up to 200  feet, supervision only.  She sometimes requires cues for safety; went with shortness of breath during physical therapy, oxygen saturation stays greater than 90%.  Patient is on continuous oxygen supplementation, 2 L/min.  Continues on Isosorbide, Hydralazine, Carvidilol Lisinopril for heart disease/HTN; on Lorazapam for anxiety and has her breathing treatments as needed.  Appetite is fair,  No added salt diet, regulat texture, thin liquid.  She denied pain/discomfort; in no acute distress, no medical acuity.  She is compliant with her medications.  Nursing staff with no complaint at this time.  Encouraged ongoing care.   Follow up: Palliative care will continue to follow patient for goals of care clarification and symptom management. I spent 1 hour and 46 minutes providing this consultation; time iincludes time spent with patient/family, chart review, provider coordination,  and documentation. More than 50% of the time in this consultation was spent on coordinating communication  HISTORY OF PRESENT ILLNESS:  Leslie Sellers is a 86 y.o. year old female with multiple medical problems including COPD, unspecified systolic congestive heart failure, atherosclerotic heart disease of native coronary artery without angina pectoris, hypertension, GERD, anxiety disorder. Palliative Care was asked to help address goals of care.   CODE STATUS: DNR  PPS: 50% HOSPICE ELIGIBILITY/DIAGNOSIS: TBD  PAST MEDICAL HISTORY:  Past Medical History:  Diagnosis Date  . Acute kidney injury (HCC)   . Arthritis   . CHF (congestive heart failure) (HCC)   . COPD (chronic obstructive pulmonary disease) (HCC)   . Hypertension     SOCIAL HX:  Social History   Tobacco Use  . Smoking status: Never Smoker  . Smokeless tobacco: Never Used  Substance Use Topics  . Alcohol use: Never    ALLERGIES:  No Known Allergies   PERTINENT MEDICATIONS:  Outpatient Encounter Medications as of 12/05/2019  Medication Sig  . acetaminophen  (TYLENOL) 500 MG tablet Take 500 mg by mouth 2 (two) times daily.  Marland Kitchen amLODipine (NORVASC) 5 MG tablet Take 5 mg by mouth daily.  . carvedilol (COREG) 12.5 MG tablet Take 12.5 mg by mouth 2 (two) times daily with a meal.  . Cholecalciferol (VITAMIN D-3) 125 MCG (5000 UT) TABS Take 5,000 Units by mouth 3 (three) times a week.  . citalopram (CELEXA) 10 MG tablet Take 10 mg by mouth daily.  . clopidogrel (PLAVIX) 75 MG tablet Take 75 mg by mouth daily.  . hydrALAZINE (APRESOLINE) 50 MG tablet Take 50 mg by mouth 3 (three) times daily.  . isosorbide dinitrate (ISORDIL) 30 MG tablet Take 30 mg by mouth daily.  Marland Kitchen lisinopril (ZESTRIL) 40 MG tablet Take 40 mg by mouth every evening.  Marland Kitchen LORazepam (ATIVAN) 0.5 MG tablet Take 0.5-1 mg by mouth See admin instructions. Take one tablet (0.5 mg) by mouth every morning and 2 tablets (1 mg) at night  . meclizine (ANTIVERT) 12.5 MG tablet Take 12.5 mg by mouth 3 (three) times daily.  Marland Kitchen neomycin-bacitracin-polymyxin (NEOSPORIN) OINT Apply 1 application topically 2 (two) times daily as needed for wound care.  . pantoprazole (PROTONIX) 40 MG tablet Take 40 mg by mouth at bedtime.  . pravastatin (PRAVACHOL) 40 MG tablet Take 40 mg by mouth at bedtime.   No facility-administered encounter medications on file as of 12/05/2019.    PHYSICAL EXAM/ROS:  General: NAD, frail appearing, thin, cooperative Cardiovascular: regular rate and rhythm; denies chest pain Pulmonary: clear ant fields; no adventitious lung sounds auscultated; continuous oxygen supplementation 2 L/min Abdomen: soft, nontender, + bowel sounds GU: no suprapubic tenderness Extremities: no edema, no joint deformities Skin: no rashes to exposed skin Neurological: Weakness but otherwise nonfocal  Teodoro Spray, NP

## 2020-01-02 ENCOUNTER — Inpatient Hospital Stay (HOSPITAL_COMMUNITY)
Admission: EM | Admit: 2020-01-02 | Discharge: 2020-01-25 | DRG: 951 | Disposition: E | Payer: MEDICARE | Attending: Internal Medicine | Admitting: Internal Medicine

## 2020-01-02 ENCOUNTER — Encounter (HOSPITAL_COMMUNITY): Payer: Self-pay | Admitting: Internal Medicine

## 2020-01-02 ENCOUNTER — Emergency Department (HOSPITAL_COMMUNITY): Payer: MEDICARE

## 2020-01-02 ENCOUNTER — Other Ambulatory Visit: Payer: Self-pay

## 2020-01-02 DIAGNOSIS — I2510 Atherosclerotic heart disease of native coronary artery without angina pectoris: Secondary | ICD-10-CM | POA: Diagnosis present

## 2020-01-02 DIAGNOSIS — I607 Nontraumatic subarachnoid hemorrhage from unspecified intracranial artery: Secondary | ICD-10-CM | POA: Diagnosis present

## 2020-01-02 DIAGNOSIS — I110 Hypertensive heart disease with heart failure: Secondary | ICD-10-CM | POA: Diagnosis present

## 2020-01-02 DIAGNOSIS — I613 Nontraumatic intracerebral hemorrhage in brain stem: Secondary | ICD-10-CM | POA: Diagnosis present

## 2020-01-02 DIAGNOSIS — I629 Nontraumatic intracranial hemorrhage, unspecified: Secondary | ICD-10-CM

## 2020-01-02 DIAGNOSIS — R4182 Altered mental status, unspecified: Secondary | ICD-10-CM | POA: Diagnosis not present

## 2020-01-02 DIAGNOSIS — M1990 Unspecified osteoarthritis, unspecified site: Secondary | ICD-10-CM | POA: Diagnosis present

## 2020-01-02 DIAGNOSIS — Z515 Encounter for palliative care: Principal | ICD-10-CM | POA: Diagnosis present

## 2020-01-02 DIAGNOSIS — I509 Heart failure, unspecified: Secondary | ICD-10-CM | POA: Diagnosis present

## 2020-01-02 DIAGNOSIS — I619 Nontraumatic intracerebral hemorrhage, unspecified: Secondary | ICD-10-CM | POA: Diagnosis present

## 2020-01-02 DIAGNOSIS — L8990 Pressure ulcer of unspecified site, unspecified stage: Secondary | ICD-10-CM | POA: Diagnosis present

## 2020-01-02 DIAGNOSIS — G8194 Hemiplegia, unspecified affecting left nondominant side: Secondary | ICD-10-CM | POA: Diagnosis present

## 2020-01-02 DIAGNOSIS — Z66 Do not resuscitate: Secondary | ICD-10-CM | POA: Diagnosis present

## 2020-01-02 DIAGNOSIS — I615 Nontraumatic intracerebral hemorrhage, intraventricular: Secondary | ICD-10-CM | POA: Diagnosis present

## 2020-01-02 DIAGNOSIS — Z79899 Other long term (current) drug therapy: Secondary | ICD-10-CM

## 2020-01-02 DIAGNOSIS — Z20822 Contact with and (suspected) exposure to covid-19: Secondary | ICD-10-CM | POA: Diagnosis present

## 2020-01-02 DIAGNOSIS — J449 Chronic obstructive pulmonary disease, unspecified: Secondary | ICD-10-CM | POA: Diagnosis present

## 2020-01-02 DIAGNOSIS — Z7901 Long term (current) use of anticoagulants: Secondary | ICD-10-CM

## 2020-01-02 DIAGNOSIS — Z87891 Personal history of nicotine dependence: Secondary | ICD-10-CM

## 2020-01-02 LAB — COMPREHENSIVE METABOLIC PANEL
ALT: 26 U/L (ref 0–44)
AST: 47 U/L — ABNORMAL HIGH (ref 15–41)
Albumin: 3.7 g/dL (ref 3.5–5.0)
Alkaline Phosphatase: 53 U/L (ref 38–126)
Anion gap: 14 (ref 5–15)
BUN: 25 mg/dL — ABNORMAL HIGH (ref 8–23)
CO2: 22 mmol/L (ref 22–32)
Calcium: 8.9 mg/dL (ref 8.9–10.3)
Chloride: 105 mmol/L (ref 98–111)
Creatinine, Ser: 1.26 mg/dL — ABNORMAL HIGH (ref 0.44–1.00)
GFR calc Af Amer: 43 mL/min — ABNORMAL LOW (ref >60)
GFR calc non Af Amer: 37 mL/min — ABNORMAL LOW (ref >60)
Glucose, Bld: 172 mg/dL — ABNORMAL HIGH (ref 70–99)
Potassium: 4.5 mmol/L (ref 3.5–5.1)
Sodium: 141 mmol/L (ref 135–145)
Total Bilirubin: 1 mg/dL (ref 0.3–1.2)
Total Protein: 6.6 g/dL (ref 6.5–8.1)

## 2020-01-02 LAB — DIFFERENTIAL
Abs Immature Granulocytes: 0.05 10*3/uL (ref 0.00–0.07)
Basophils Absolute: 0 10*3/uL (ref 0.0–0.1)
Basophils Relative: 0 %
Eosinophils Absolute: 0 10*3/uL (ref 0.0–0.5)
Eosinophils Relative: 0 %
Immature Granulocytes: 0 %
Lymphocytes Relative: 7 %
Lymphs Abs: 0.9 10*3/uL (ref 0.7–4.0)
Monocytes Absolute: 0.8 10*3/uL (ref 0.1–1.0)
Monocytes Relative: 6 %
Neutro Abs: 11.7 10*3/uL — ABNORMAL HIGH (ref 1.7–7.7)
Neutrophils Relative %: 87 %

## 2020-01-02 LAB — CBC
HCT: 35.5 % — ABNORMAL LOW (ref 36.0–46.0)
Hemoglobin: 11.2 g/dL — ABNORMAL LOW (ref 12.0–15.0)
MCH: 30.5 pg (ref 26.0–34.0)
MCHC: 31.5 g/dL (ref 30.0–36.0)
MCV: 96.7 fL (ref 80.0–100.0)
Platelets: 225 10*3/uL (ref 150–400)
RBC: 3.67 MIL/uL — ABNORMAL LOW (ref 3.87–5.11)
RDW: 12.3 % (ref 11.5–15.5)
WBC: 13.5 10*3/uL — ABNORMAL HIGH (ref 4.0–10.5)
nRBC: 0 % (ref 0.0–0.2)

## 2020-01-02 LAB — APTT: aPTT: 32 seconds (ref 24–36)

## 2020-01-02 LAB — I-STAT CHEM 8, ED
BUN: 27 mg/dL — ABNORMAL HIGH (ref 8–23)
Calcium, Ion: 1.06 mmol/L — ABNORMAL LOW (ref 1.15–1.40)
Chloride: 103 mmol/L (ref 98–111)
Creatinine, Ser: 1.2 mg/dL — ABNORMAL HIGH (ref 0.44–1.00)
Glucose, Bld: 172 mg/dL — ABNORMAL HIGH (ref 70–99)
HCT: 35 % — ABNORMAL LOW (ref 36.0–46.0)
Hemoglobin: 11.9 g/dL — ABNORMAL LOW (ref 12.0–15.0)
Potassium: 4.5 mmol/L (ref 3.5–5.1)
Sodium: 141 mmol/L (ref 135–145)
TCO2: 23 mmol/L (ref 22–32)

## 2020-01-02 LAB — PROTIME-INR
INR: 0.9 (ref 0.8–1.2)
Prothrombin Time: 12.2 seconds (ref 11.4–15.2)

## 2020-01-02 LAB — ETHANOL: Alcohol, Ethyl (B): <10 mg/dL (ref <10)

## 2020-01-02 LAB — SARS CORONAVIRUS 2 BY RT PCR (HOSPITAL ORDER, PERFORMED IN ~~LOC~~ HOSPITAL LAB): SARS Coronavirus 2: NEGATIVE

## 2020-01-02 LAB — CBG MONITORING, ED: Glucose-Capillary: 161 mg/dL — ABNORMAL HIGH (ref 70–99)

## 2020-01-02 MED ORDER — GLYCOPYRROLATE 0.2 MG/ML IJ SOLN
0.2000 mg | INTRAMUSCULAR | Status: DC | PRN
  Administered 2020-01-03 (×2): 0.2 mg via INTRAVENOUS
  Filled 2020-01-02 (×2): qty 1

## 2020-01-02 MED ORDER — ONDANSETRON 4 MG PO TBDP: 4.0000 mg | ORAL_TABLET | Freq: Four times a day (QID) | ORAL | Status: DC | PRN

## 2020-01-02 MED ORDER — GLYCOPYRROLATE 0.2 MG/ML IJ SOLN: 0.2000 mg | INTRAMUSCULAR | Status: DC | PRN

## 2020-01-02 MED ORDER — GLYCOPYRROLATE 1 MG PO TABS
1.0000 mg | ORAL_TABLET | ORAL | Status: DC | PRN
  Filled 2020-01-02: qty 1

## 2020-01-02 MED ORDER — POLYVINYL ALCOHOL 1.4 % OP SOLN
1.0000 [drp] | Freq: Four times a day (QID) | OPHTHALMIC | Status: DC | PRN
  Filled 2020-01-02: qty 15

## 2020-01-02 MED ORDER — MORPHINE SULFATE (PF) 4 MG/ML IV SOLN
4.0000 mg | INTRAVENOUS | Status: DC | PRN
  Administered 2020-01-02 – 2020-01-03 (×5): 4 mg via INTRAVENOUS
  Filled 2020-01-02 (×5): qty 1

## 2020-01-02 MED ORDER — HALOPERIDOL 0.5 MG PO TABS: 0.5000 mg | ORAL_TABLET | ORAL | Status: DC | PRN

## 2020-01-02 MED ORDER — HALOPERIDOL LACTATE 5 MG/ML IJ SOLN
0.5000 mg | INTRAMUSCULAR | Status: DC | PRN
  Administered 2020-01-03 (×2): 0.5 mg via INTRAVENOUS
  Filled 2020-01-02 (×2): qty 1

## 2020-01-02 MED ORDER — IOHEXOL 350 MG/ML SOLN
75.0000 mL | Freq: Once | INTRAVENOUS | Status: AC | PRN
  Administered 2020-01-02: 75 mL via INTRAVENOUS

## 2020-01-02 MED ORDER — HALOPERIDOL LACTATE 2 MG/ML PO CONC
0.5000 mg | ORAL | Status: DC | PRN
  Filled 2020-01-02: qty 0.3

## 2020-01-02 MED ORDER — BIOTENE DRY MOUTH MT LIQD: 15.0000 mL | OROMUCOSAL | Status: DC | PRN

## 2020-01-02 MED ORDER — ONDANSETRON HCL 4 MG/2ML IJ SOLN
4.0000 mg | Freq: Four times a day (QID) | INTRAMUSCULAR | Status: DC | PRN
  Administered 2020-01-02: 4 mg via INTRAVENOUS
  Filled 2020-01-02: qty 2

## 2020-01-02 NOTE — ED Triage Notes (Signed)
Pt at home. Home health nurse there concerned about altered mental status. Pt alert when coming in to self and will answer questions. She is not opening eyes.

## 2020-01-02 NOTE — ED Provider Notes (Signed)
Care assumed from Dr. Rhunette Croft.  At time of transfer of care, patient is awaiting palliative evaluation for nonsurvivable intracranial hemorrhage.  I was contacted by the case management and palliative teams and they say there are no inpatient beds at [facility] for her hospice management.  They are recommending I get admitted to medicine for a palliative mission until she can go to [facility] tomorrow.  Will call hospitalist team for admission.   Yeshaya Vath, Canary Brim, MD 2020/01/05 2255

## 2020-01-02 NOTE — Progress Notes (Signed)
Code stroke paged out at 1258. Pt arrived to [facility] via EMS at 1307. Upon arrival, pt was an NIH of 16 and moderately SOB. Her deficits were left facial palsy, left arm and leg weakness, rt leg weakness and severe dysarthria.Per EMS, pt's baseline is lives alone with [facility], walks with walker and home O2. She remained on her home dose O2 via [place]. Pt was taken to CT at 1310. At 1313, Dr Amada Jupiter stated that pt had a bleed, and would therefore not be a candidate for TPA or mechanical thrombectomy. Care handed over to [facility]. Pt to have q 1 hr VS and neuro checks.

## 2020-01-02 NOTE — ED Notes (Signed)
The pts daughter has gone out to eat  She will return  pts condition is unchanged

## 2020-01-02 NOTE — ED Notes (Signed)
Family at bedside. Pt covered in warm blankets due to fact that she keeps saying "im cold". Pt denies pain.

## 2020-01-02 NOTE — ED Notes (Signed)
No change 

## 2020-01-02 NOTE — ED Notes (Signed)
Report given to kendra rn on 3w

## 2020-01-02 NOTE — Consult Note (Signed)
Reason for Consult: [facility] Referring Physician: Dr. Rhunette Croft   HPI: Leslie Sellers is a 86 y.o. female who was found this morning by home health and was noted to be alerted from her baseline status. Per family, she was last known to be well last night around 1900 hours when she went upstairs to bed. When home health arrived this morning, she was found to have left-sided weakness, confusion, and was lethargic. EMS was subsequently called who then activated a code stroke. The patient arrived to the ED in a typical code stroke fashion and was taken for a STAT CT head. CT head was remarkable for a large subarachnoid hemorrhage. Per family, at her baseline she is alert.   Past Medical History:  Diagnosis Date  . Acute kidney injury (HCC)   . Arthritis   . CHF (congestive heart failure) (HCC)   . COPD (chronic obstructive pulmonary disease) (HCC)   . Hypertension     No past surgical history on file.  No family history on file.  Social History:  reports that she has never smoked. She has never used smokeless tobacco. She reports that she does not drink alcohol and does not use drugs.  Allergies: No Known Allergies  Medications: I have reviewed the patient's current medications.  Results for orders placed or performed during the hospital encounter of 01/09/2020 (from the past 48 hour(s))  CBG monitoring, ED     Status: Abnormal   Collection Time: 01/08/2020  1:09 PM  Result Value Ref Range   Glucose-Capillary 161 (H) 70 - 99 mg/dL    Comment: Glucose reference range applies only to samples taken after fasting for at least 8 hours.  Ethanol     Status: None   Collection Time: 01/01/2020  1:11 PM  Result Value Ref Range   Alcohol, Ethyl (B) <10 <10 mg/dL    Comment: (NOTE) Lowest detectable limit for serum alcohol is 10 mg/dL.  For medical purposes only. Performed at [facility], [address], [place], [place] [address]   Protime-INR     Status: None   Collection Time: 01/05/2020  1:11  PM  Result Value Ref Range   Prothrombin Time 12.2 11.4 - 15.2 seconds   INR 0.9 0.8 - 1.2    Comment: (NOTE) INR goal varies based on device and disease states. Performed at [facility], [address] [place], [place] [address]   APTT     Status: None   Collection Time: 01/13/2020  1:11 PM  Result Value Ref Range   aPTT 32 24 - 36 seconds    Comment: Performed at [facility], [address], [place], [place] [address]  CBC     Status: Abnormal   Collection Time: 01/01/2020  1:11 PM  Result Value Ref Range   WBC 13.5 (H) 4.0 - 10.5 K/uL   RBC 3.67 (L) 3.87 - 5.11 MIL/uL   Hemoglobin 11.2 (L) 12.0 - 15.0 g/dL   HCT 21.3 (L) 36 - 46 %   MCV 96.7 80.0 - 100.0 fL   MCH 30.5 26.0 - 34.0 pg   MCHC 31.5 30.0 - 36.0 g/dL   RDW 08.6 57.8 - 46.9 %   Platelets 225 150 - 400 K/uL   nRBC 0.0 0.0 - 0.2 %    Comment: Performed at [facility], [address], [place], [place] [address]  Differential     Status: Abnormal   Collection Time: 12/28/2019  1:11 PM  Result Value Ref Range   Neutrophils Relative % 87 %   Neutro Abs 11.7 (H) 1.7 - 7.7 K/uL   Lymphocytes Relative 7 %   Lymphs Abs 0.9 0.7 - 4.0 K/uL   Monocytes Relative 6 %   Monocytes Absolute 0.8 0 - 1 K/uL   Eosinophils Relative 0 %   Eosinophils Absolute 0.0 0 - 0 K/uL   Basophils Relative 0 %   Basophils Absolute 0.0 0 - 0 K/uL   Immature Granulocytes 0 %   Abs Immature Granulocytes 0.05 0.00 - 0.07 K/uL    Comment: Performed at [facility], [address] [place], [place] [address]  Comprehensive metabolic panel     Status: Abnormal   Collection Time: 01/14/2020  1:11 PM  Result Value Ref Range   Sodium 141 135 - 145 mmol/L   Potassium 4.5 3.5 - 5.1 mmol/L   Chloride 105 98 - 111 mmol/L   CO2 22 22 - 32 mmol/L   Glucose, Bld 172 (H) 70 - 99 mg/dL    Comment: Glucose reference range applies only to samples taken after fasting for at least 8 hours.   BUN 25 (H) 8 - 23 mg/dL   Creatinine, Ser 6.04  (H) 0.44 - 1.00 mg/dL   Calcium 8.9 8.9 - 54.0 mg/dL   Total Protein 6.6 6.5 - 8.1 g/dL   Albumin 3.7 3.5 - 5.0 g/dL   AST 47 (H) 15 - 41 U/L   ALT 26 0 - 44 U/L   Alkaline Phosphatase 53 38 - 126 U/L   Total Bilirubin 1.0 0.3 - 1.2 mg/dL   GFR calc non Af Amer 37 (L) >60 mL/min   GFR calc Af Amer 43 (L) >60 mL/min   Anion gap 14 5 - 15    Comment: Performed at [facility], [address], [place], [place] [address]  I-stat chem 8, ED     Status: Abnormal   Collection Time: 12/28/2019  1:14 PM  Result Value Ref Range   Sodium 141 135 - 145 mmol/L   Potassium 4.5 3.5 - 5.1 mmol/L   Chloride 103 98 - 111 mmol/L   BUN 27 (H) 8 - 23 mg/dL   Creatinine, Ser 1.47 (H) 0.44 - 1.00 mg/dL   Glucose, Bld 829 (H) 70 - 99 mg/dL    Comment: Glucose reference range applies only to samples taken after fasting for at least 8 hours.   Calcium, Ion 1.06 (L) 1.15 - 1.40 mmol/L   TCO2 23 22 - 32 mmol/L   Hemoglobin 11.9 (L) 12.0 - 15.0 g/dL   HCT 56.2 (L) 36 - 46 %    CT ANGIO HEAD W OR WO CONTRAST  Result Date: 01/21/2020 CLINICAL DATA:  Stroke, follow-up. EXAM: CT ANGIOGRAPHY HEAD AND NECK TECHNIQUE: Multidetector CT imaging of the head and neck was performed using the standard protocol during bolus administration of intravenous contrast. Multiplanar CT image reconstructions and MIPs were obtained to evaluate the vascular anatomy. Carotid stenosis measurements (when applicable) are obtained utilizing NASCET criteria, using the distal internal carotid diameter as the denominator. CONTRAST:  Administered contrast not known at this time. COMPARISON:  No pertinent prior studies available for comparison. FINDINGS: CTA NECK FINDINGS Aortic arch: Standard aortic branching. Atherosclerotic plaque within the visualized aortic arch and proximal major branch vessels of the neck. Narrowing of proximal innominate artery of approximately 50%. 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 There is reconstitution  of enhancement more distally within  the left subclavian artery. Right carotid system: CCA and ICA patent within the neck. Calcified plaque within the carotid bifurcation and proximal ICA. Resultant stenosis of the proximal ICA estimated at up to 70%. There is also mild atherosclerotic narrowing of the distal cervical ICA. Left carotid system: Atherosclerotic plaque at the origin of the CCA without hemodynamically significant stenosis (50% or greater). Scattered plaque within the remainder of the CCA without significant stenosis. There is calcified plaque within the carotid bifurcation and proximal ICA. There is apparent high-grade stenosis of the proximal ICA. However, this may be overestimated due to blooming of calcified plaque and motion artifact. Vertebral arteries: Nonstenotic calcified plaque at the origin of the non dominant right vertebral artery and scattered throughout the remainder of the vessel within the neck. Skeleton: No acute bony abnormality. Cervical spondylosis with multilevel disc space narrowing, posterior disc osteophytes, uncovertebral and facet hypertrophy. 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 Other neck: No neck mass or cervical lymphadenopathy. Upper chest: Pulmonary emphysema. No consolidation within the imaged lung apices. Review of the MIP images confirms the above findings CTA HEAD FINDINGS Anterior circulation: The intracranial internal carotid arteries are patent with calcified plaque but no more than mild stenosis. The M1 middle cerebral arteries are patent without significant stenosis. No M2 proximal branch occlusion is identified. Moderate stenosis at the origin of a superior division proximal M2 left MCA branch. The anterior cerebral arteries are patent without significant proximal stenosis. No anterior circulation aneurysm is identified Posterior circulation: The  intracranial vertebral arteries are patent. 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 The basilar artery is patent without significant stenosis. The posterior cerebral arteries are patent without significant proximal stenosis. Posterior communicating arteries are hypoplastic or absent bilaterally. Venous sinuses: Within limitations of contrast timing, no convincing thrombus. Anatomic variants: As described Review of the MIP images confirms the above findings These results were called by telephone at the time of interpretation on January 13, 2020 at 1:56 pm to provider PA Claudette Stapler, who verbally acknowledged these results. IMPRESSION: CTA neck: 1. Atherosclerotic disease within the aortic arch and major branch vessels of the neck, most notably as follows. 2. 50% stenosis of the proximal innominate artery. 3. Stenosis of the proximal right ICA estimated at up to 70%. 4. Apparent high-grade stenosis of the proximal left ICA. However, this stenosis may be overestimated due to blooming from calcified plaque with motion degradation at this level. Carotid artery duplex is recommended for further evaluation. 5. 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 6. The dominant right vertebral artery is patent within the neck without significant stenosis. CTA head: 1. 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 2. 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 3. No intracranial large vessel occlusion or proximal high-grade arterial stenosis. 4. Calcified plaque within the intracranial internal carotid arteries with no more than mild stenosis. 5.  Moderate stenosis at the origin of a superior division proximal M2 left MCA branch vessel. Electronically Signed   By: Jackey Loge DO   On: 01/13/2020 13:58  CT ANGIO NECK W OR WO CONTRAST  Result Date: 01/04/2020 CLINICAL DATA:  Stroke, follow-up. EXAM: CT ANGIOGRAPHY HEAD AND NECK TECHNIQUE: Multidetector CT imaging of the head and neck was performed using the standard protocol during bolus administration of intravenous contrast. Multiplanar CT image reconstructions and MIPs were obtained to evaluate the vascular anatomy. Carotid stenosis measurements (when applicable) are obtained utilizing NASCET criteria, using the distal internal carotid diameter as the denominator. CONTRAST:  Administered contrast not known at this time. COMPARISON:  No pertinent prior studies available for comparison. FINDINGS: CTA NECK FINDINGS Aortic arch: Standard aortic branching. Atherosclerotic plaque within the visualized aortic arch and proximal major branch vessels of the neck. Narrowing of proximal innominate artery of approximately 50%. 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 There is reconstitution of enhancement more distally within the left subclavian artery. Right carotid system: CCA and ICA patent within the neck. Calcified plaque within the carotid bifurcation and proximal ICA. Resultant stenosis of the proximal ICA estimated at up to 70%. There is also mild atherosclerotic narrowing of the distal cervical ICA. Left carotid system: Atherosclerotic plaque at the origin of the CCA without hemodynamically significant stenosis (50% or greater). Scattered plaque within the remainder of the CCA without significant stenosis. There is calcified plaque within the carotid bifurcation and proximal ICA. There is apparent high-grade stenosis of the proximal ICA. However, this may be overestimated due to blooming of calcified plaque and motion artifact. Vertebral arteries: Nonstenotic calcified plaque at the origin of the  non dominant right vertebral artery and scattered throughout the remainder of the vessel within the neck. Skeleton: No acute bony abnormality. Cervical spondylosis with multilevel disc space narrowing, posterior disc osteophytes, uncovertebral and facet hypertrophy. 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 Other neck: No neck mass or cervical lymphadenopathy. Upper chest: Pulmonary emphysema. No consolidation within the imaged lung apices. Review of the MIP images confirms the above findings CTA HEAD FINDINGS Anterior circulation: The intracranial internal carotid arteries are patent with calcified plaque but no more than mild stenosis. The M1 middle cerebral arteries are patent without significant stenosis. No M2 proximal branch occlusion is identified. Moderate stenosis at the origin of a superior division proximal M2 left MCA branch. The anterior cerebral arteries are patent without significant proximal stenosis. No anterior circulation aneurysm is identified Posterior circulation: The intracranial vertebral arteries are patent. 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 The basilar artery is patent without significant stenosis. The posterior cerebral arteries are patent without significant proximal stenosis. Posterior communicating arteries are hypoplastic or absent bilaterally. Venous sinuses: Within limitations of contrast timing, no convincing thrombus. Anatomic variants: As described Review of the MIP images confirms the above findings These results were called by telephone at the time of interpretation on 01/14/2020 at 1:56 pm to provider PA Claudette Stapleraroline Aberman, who verbally acknowledged these results. IMPRESSION: CTA neck: 1. Atherosclerotic disease within the aortic arch and major branch vessels of the neck, most  notably as follows. 2. 50% stenosis of the proximal innominate artery. 3. Stenosis of the proximal right ICA estimated at up to 70%. 4. Apparent high-grade stenosis of the proximal left ICA. However, this stenosis may be overestimated due to blooming from calcified plaque with motion degradation at this level. Carotid artery duplex is recommended for further evaluation. 5. The  proximal left subclavian artery is occluded. 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 6. The dominant right vertebral artery is patent within the neck without significant stenosis. CTA head: 1. 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 2. 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 3. No intracranial large vessel occlusion or proximal high-grade arterial stenosis. 4. Calcified plaque within the intracranial internal carotid arteries with no more than mild stenosis. 5. Moderate stenosis at the origin of a superior division proximal M2 left MCA branch vessel. Electronically Signed   By: Jackey Loge DO   On: January 30, 2020 13:58   CT HEAD CODE STROKE WO CONTRAST  Addendum Date: January 30, 2020   ADDENDUM REPORT: 2020/01/30 13:31 ADDENDUM: These results were called by telephone at the time of interpretation on 30-Jan-2020 at 1:31 pm to provider Claudette Stapler, who verbally acknowledged these results. Electronically Signed   By: Jackey Loge DO   On: 30-Jan-2020 13:31   Result Date: 01/30/2020 CLINICAL DATA:  Code stroke.  Ataxia, stroke suspected EXAM: CT HEAD WITHOUT CONTRAST TECHNIQUE: Contiguous axial images were obtained from the base of the skull through the vertex without intravenous contrast. COMPARISON:  Noncontrast head CT 06/25/2021 FINDINGS: Brain: There is large volume acute  subarachnoid hemorrhage greatest within the basal cisterns, prepontine cistern, posterior fossa, anterior interhemispheric fissure, MCA cisterns. There is a moderate to large amount of intraventricular hemorrhage greatest within the posterior lateral ventricles, within the third ventricle and within the fourth ventricle. There is mildly increased prominence of the lateral and third ventricles as compared to prior head CT 06/26/2019 suspicious for early hydrocephalus. Stable moderate generalized parenchymal atrophy. Stable, moderate/advanced patchy and confluent hypoattenuation within the cerebral white matter which is nonspecific, but consistent with chronic small vessel ischemic disease. No acute demarcated cortical infarct is identified. No extra-axial fluid collection. No evidence of intracranial mass. No midline shift. Redemonstrated left middle cranial fossa arachnoid cyst Vascular: No hyperdense vessel is appreciated. Atherosclerotic calcifications. Skull: Normal. Negative for fracture or focal lesion. Sinuses/Orbits: Visualized orbits show no acute finding. Small left maxillary sinus mucous retention cyst. Right mastoid effusion. IMPRESSION: Large volume acute subarachnoid hemorrhage greatest within the basal cisterns, posterior fossa, anterior interhemispheric fissure and MCA cisterns. There is moderate to large intraventricular hemorrhage greatest within the posterior lateral ventricles, third ventricle and fourth ventricle. Findings are suspicious for aneurysmal subarachnoid hemorrhage. Stable generalized parenchymal atrophy and chronic small vessel ischemic disease. Electronically Signed: By: Jackey Loge DO On: 01-30-2020 13:26    Review of Systems - Unable to assess due to altered mental status  Blood pressure (!) 150/117, pulse 86, resp. rate (!) 27, weight 50 kg, SpO2 100 %. Physical Exam Patient is lethargic but arousable to noxious stimuli. Left-sided facial weakness and LUE weakness. She  appears to be in no apparent distress, protecting her airway, and vital signs stable.   Assessment/Plan:  CT head showed a large volume acute subarachnoid hemorrhage with extension into the posterior lateral ventricles, third ventricle and fourth ventricle. Brainstem appeared dusky. She is at high risk for obstructive hydrocephalous. 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  Her condition is likely unsurvivable and has a grim prognosis. If she does survive this, it is  highly likely that she will need supportive care indefinitely. I have discussed this case with Dr. Rhunette Croft and the patient's daughter who was at bedside, and all wish to proceed with comfort care. I am in agreeance with transitioning to comfort care and believe this is what is in the patient's best interest. I appreciate Dr, Rhunette Croft and Palliative Care's help with this patient. Call       Dorian Heckle, MD 2020/01/18, 3:52 PM

## 2020-01-02 NOTE — H&P (Signed)
Date: 01/05/2020               Patient Name:  Leslie Sellers MRN: [id number]  DOB: Feb 05, 1926 Age / Sex: 86 y.o., female   PCP: Marletta Lor, NP         Medical Service: Internal Medicine Teaching Service         Attending Physician: Dr. Sandre Kitty    First Contact: Dr. Cyndie Chime Pager: [phone]  Second Contact: Dortha Schwalbe, MD, Znya Albino Pager: [phone])       After Hours (After 5p/  First Contact Pager: [phone]  weekends / holidays): Second Contact Pager: [phone]   Chief Complaint: Altered mental status  History of Present Illness:   *History provided via chart review and from family*  [name] is a 86 year old woman with medical history significant for COPD, congestive heart failure unspecified type, coronary artery disease, hypertension, GERD and anxiety disorder who presented to [facility] emergency department with altered mental status.  Her home health nurse had noticed that she appeared confused from her baseline and had also reported left-sided weakness when she arrived to check on her this morning.  The nursing aide had also reported she was lethargic.  Apparently she usually sleeps around 1030 or 11 PM and her last known well was around 7 PM yesterday when she went upstairs to sleep.  On arrival to the ED, a code stroke was called and CT head had revealed significantly large volume acute subarachnoid hemorrhage at the basal cisterns, posterior fossa, anterior interhemispheric fissure and MCA cisterns.  There was also moderate to large intraventricular hemorrhage greatest within the posterior lateral ventricles, third ventricle and fourth ventricle.  Neurology and neurosurgery were both consulted in the emergency department and had ongoing discussion with family due to the grave nature and nonsurvivable nature of [name] condition.  After much discussion, the family had elected to proceed to comfort care for which palliative medicine ([facility]) was consulted.  I was able to speak to  patient's daughter who was able to clarify all events that had transpired and was agreeable with above plan.  On further questioning, she denied any history of trauma or fall.   Lab Orders     Ethanol     Protime-INR     APTT     CBC     Differential     Comprehensive metabolic panel     Urine rapid drug screen (hosp performed)     Urinalysis, Routine w reflex microscopic     I-stat chem 8, ED     CBG monitoring, ED   Meds:  No outpatient medications have been marked as taking for the 12/27/2019 encounter [facility] Encounter).    Allergies: Allergies as of 01/06/2020  . (No Known Allergies)   Past Medical History:  Diagnosis Date  . Acute kidney injury (HCC)   . Arthritis   . CHF (congestive heart failure) (HCC)   . COPD (chronic obstructive pulmonary disease) (HCC)   . Hypertension     Family History: Noncontributory  Social History: Noncontributory  Review of Systems: A complete ROS was negative except as per HPI.   Physical Exam: Blood pressure (!) 150/117, pulse 75, resp. rate (!) 27, weight 50 kg, SpO2 96 %. Physical Exam Vitals and nursing note reviewed.  Constitutional:      Comments: Only withdraws to painful stimuli  HENT:     Head: Normocephalic and atraumatic.     Nose: Nose normal.  Eyes:  General: No scleral icterus.       Right eye: No discharge.        Left eye: No discharge.     Conjunctiva/sclera: Conjunctivae normal.     Pupils: Pupils are equal, round, and reactive to light.  Cardiovascular:     Rate and Rhythm: Normal rate.  Pulmonary:     Breath sounds: Normal breath sounds. No wheezing or rales.  Abdominal:     General: Abdomen is flat.  Musculoskeletal:        General: No swelling.  Skin:    General: Skin is warm.  Neurological:     Mental Status: She is disoriented.     Comments: Neuro exam significantly limited due to patient's altered mental status     EKG: personally reviewed my interpretation is normal sinus  rhythm  Assessment & Plan by Problem: Principal Problem:   Intracranial hemorrhage (HCC) Active Problems:   Comfort measures only status   #Nonsurvivable acute intracranial hemorrhage [name] is a 86 year old woman with medical history significant for COPD, congestive heart failure unspecified type, coronary artery disease, hypertension, GERD and anxiety disorder who presented to [facility] emergency department with altered mental status, left-sided weakness and lethargy.  Her last known well was 1900 on January 01, 2020.  On arrival to the ED, a code stroke was activated and a subsequent CT scanning of the head reviewed and acute large intracranial hemorrhage.  Neurology and neurosurgery were consulted and after extensive discussion with the family, decision was made to transition to comfort care.  Palliative medicine has been consulted and plan is to discharge patient to [facility]. -Continue comfort measures with antiseptic oral rinse, Robinul, Haldol, morphine, Zofran -Anticipated survival less than 2 weeks -Appreciate palliative medicine recommendation -Discontinue home medications -Supplemental oxygen for comfort  Dispo: Admit patient to Observation with expected length of stay less than 2 midnights.  Signed: Yvette Rack, MD 2020-01-21, 5:29 PM  Pager: [phone] Internal Medicine Teaching Service After 5pm on weekdays and 1pm on weekends: On Call pager: [phone]

## 2020-01-02 NOTE — ED Notes (Signed)
The pt feels like the pt is comfortable at present  Pt lying with both closed no response to my voice

## 2020-01-02 NOTE — Social Work (Signed)
CSW called [employer] to request hospice consult. Chrislyn Brooke Dare, RN, will be reaching out to family. CSW informed EDP.

## 2020-01-02 NOTE — ED Provider Notes (Signed)
[facility] EMERGENCY DEPARTMENT Provider Note   CSN: [id number] Arrival date & time: 01/22/2020  1307     History Chief Complaint  Patient presents with  . Altered Mental Status    Leslie Sellers is a 86 y.o. female.  HPI     86 year old female comes in as code stroke.  Level 5 caveat for altered mental status.  Patient has history of CHF, COPD, heavy smoking history. She comes in as a code stroke from her home. Patient is having left-sided weakness and change in mental status. Normally she is quite alert, she was found to be lethargic by family. EMS provides the history.    Past Medical History:  Diagnosis Date  . Acute kidney injury (HCC)   . Arthritis   . CHF (congestive heart failure) (HCC)   . COPD (chronic obstructive pulmonary disease) (HCC)   . Hypertension     Patient Active Problem List   Diagnosis Date Noted  . Acute kidney injury (HCC)   . Fall   . Infectious colitis   . Dehydration   . Gastroenteritis 06/27/2019    No past surgical history on file.   OB History   No obstetric history on file.     No family history on file.  Social History   Tobacco Use  . Smoking status: Never Smoker  . Smokeless tobacco: Never Used  Vaping Use  . Vaping Use: Never used  Substance Use Topics  . Alcohol use: Never  . Drug use: Never    Home Medications Prior to Admission medications   Medication Sig Start Date End Date Taking? Authorizing Provider  acetaminophen (TYLENOL) 500 MG tablet Take 500 mg by mouth 2 (two) times daily.    [provider]  amLODipine (NORVASC) 5 MG tablet Take 5 mg by mouth daily.    [provider]  carvedilol (COREG) 12.5 MG tablet Take 12.5 mg by mouth 2 (two) times daily with a meal.    [provider]  Cholecalciferol (VITAMIN D-3) 125 MCG (5000 UT) TABS Take 5,000 Units by mouth 3 (three) times a week.    [provider]  citalopram (CELEXA) 10 MG tablet Take 10 mg by  mouth daily.    [provider]  clopidogrel (PLAVIX) 75 MG tablet Take 75 mg by mouth daily.    [provider]  hydrALAZINE (APRESOLINE) 50 MG tablet Take 50 mg by mouth 3 (three) times daily.    [provider]  isosorbide dinitrate (ISORDIL) 30 MG tablet Take 30 mg by mouth daily.    [provider]  lisinopril (ZESTRIL) 40 MG tablet Take 40 mg by mouth every evening.    [provider]  LORazepam (ATIVAN) 0.5 MG tablet Take 0.5-1 mg by mouth See admin instructions. Take one tablet (0.5 mg) by mouth every morning and 2 tablets (1 mg) at night    [provider]  meclizine (ANTIVERT) 12.5 MG tablet Take 12.5 mg by mouth 3 (three) times daily.    [provider]  neomycin-bacitracin-polymyxin (NEOSPORIN) OINT Apply 1 application topically 2 (two) times daily as needed for wound care.    [provider]  pantoprazole (PROTONIX) 40 MG tablet Take 40 mg by mouth at bedtime.    [provider]  pravastatin (PRAVACHOL) 40 MG tablet Take 40 mg by mouth at bedtime.    [provider]    Allergies    Patient has no known allergies.  Review of Systems  Review of Systems  Unable to perform ROS: Mental status change    Physical Exam Updated Vital Signs BP (!) 150/117   Pulse 86   Resp (!) 27   Wt 50 kg   SpO2 100%   BMI 20.16 kg/m   Physical Exam Vitals and nursing note reviewed.  Constitutional:      Appearance: She is well-developed.     Comments: Somnolent  Eyes:     Pupils: Pupils are equal, round, and reactive to light.  Cardiovascular:     Rate and Rhythm: Normal rate.  Pulmonary:     Effort: Pulmonary effort is normal.  Abdominal:     General: Bowel sounds are normal.  Skin:    General: Skin is warm.  Neurological:     Comments: weakness of the left upper extremity noted. Patient responding to noxious stimuli only. Protecting airway.     ED Results / Procedures / Treatments     Labs (all labs ordered are listed, but only abnormal results are displayed) Labs Reviewed  CBC - Abnormal; Notable for the following components:      Result Value   WBC 13.5 (*)    RBC 3.67 (*)    Hemoglobin 11.2 (*)    HCT 35.5 (*)    All other components within normal limits  DIFFERENTIAL - Abnormal; Notable for the following components:   Neutro Abs 11.7 (*)    All other components within normal limits  COMPREHENSIVE METABOLIC PANEL - Abnormal; Notable for the following components:   Glucose, Bld 172 (*)    BUN 25 (*)    Creatinine, Ser 1.26 (*)    AST 47 (*)    GFR calc non Af Amer 37 (*)    GFR calc Af Amer 43 (*)    All other components within normal limits  I-STAT CHEM 8, ED - Abnormal; Notable for the following components:   BUN 27 (*)    Creatinine, Ser 1.20 (*)    Glucose, Bld 172 (*)    Calcium, Ion 1.06 (*)    Hemoglobin 11.9 (*)    HCT 35.0 (*)    All other components within normal limits  CBG MONITORING, ED - Abnormal; Notable for the following components:   Glucose-Capillary 161 (*)    All other components within normal limits  ETHANOL  PROTIME-INR  APTT  RAPID URINE DRUG SCREEN, HOSP PERFORMED  URINALYSIS, ROUTINE W REFLEX MICROSCOPIC    EKG EKG Interpretation  Date/Time:  Friday January 17, 2020 13:40:10 EDT Ventricular Rate:  85 PR Interval:    QRS Duration: 109 QT Interval:  390 QTC Calculation: 453 R Axis:   58 Text Interpretation: Sinus rhythm Short PR interval Anteroseptal infarct, old No acute changes Confirmed by Derwood Kaplan [phone]) on 2020/01/17 2:17:37 PM   Radiology CT ANGIO HEAD W OR WO CONTRAST  Result Date: January 17, 2020 CLINICAL DATA:  Stroke, follow-up. EXAM: CT ANGIOGRAPHY HEAD AND NECK TECHNIQUE: Multidetector CT imaging of the head and neck was performed using the standard protocol during bolus administration of intravenous contrast. Multiplanar CT image reconstructions and MIPs were obtained to evaluate the vascular anatomy.  Carotid stenosis measurements (when applicable) are obtained utilizing NASCET criteria, using the distal internal carotid diameter as the denominator. CONTRAST:  Administered contrast not known at this time. COMPARISON:  No pertinent prior studies available for comparison. FINDINGS: CTA NECK FINDINGS Aortic arch: Standard aortic branching. Atherosclerotic plaque within the visualized aortic arch and proximal major branch vessels of the neck. Narrowing  of proximal innominate artery of approximately 50%. 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 There is reconstitution of enhancement more distally within the left subclavian artery. Right carotid system: CCA and ICA patent within the neck. Calcified plaque within the carotid bifurcation and proximal ICA. Resultant stenosis of the proximal ICA estimated at up to 70%. There is also mild atherosclerotic narrowing of the distal cervical ICA. Left carotid system: Atherosclerotic plaque at the origin of the CCA without hemodynamically significant stenosis (50% or greater). Scattered plaque within the remainder of the CCA without significant stenosis. There is calcified plaque within the carotid bifurcation and proximal ICA. There is apparent high-grade stenosis of the proximal ICA. However, this may be overestimated due to blooming of calcified plaque and motion artifact. Vertebral arteries: Nonstenotic calcified plaque at the origin of the non dominant right vertebral artery and scattered throughout the remainder of the vessel within the neck. Skeleton: No acute bony abnormality. Cervical spondylosis with multilevel disc space narrowing, posterior disc osteophytes, uncovertebral and facet hypertrophy. 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 Other neck: No neck mass or cervical lymphadenopathy. Upper chest: Pulmonary emphysema. No consolidation within the  imaged lung apices. Review of the MIP images confirms the above findings CTA HEAD FINDINGS Anterior circulation: The intracranial internal carotid arteries are patent with calcified plaque but no more than mild stenosis. The M1 middle cerebral arteries are patent without significant stenosis. No M2 proximal branch occlusion is identified. Moderate stenosis at the origin of a superior division proximal M2 left MCA branch. The anterior cerebral arteries are patent without significant proximal stenosis. No anterior circulation aneurysm is identified Posterior circulation: The intracranial vertebral arteries are patent. 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 The basilar artery is patent without significant stenosis. The posterior cerebral arteries are patent without significant proximal stenosis. Posterior communicating arteries are hypoplastic or absent bilaterally. Venous sinuses: Within limitations of contrast timing, no convincing thrombus. Anatomic variants: As described Review of the MIP images confirms the above findings These results were called by telephone at the time of interpretation on Jan 24, 2020 at 1:56 pm to provider PA Claudette Stapler, who verbally acknowledged these results. IMPRESSION: CTA neck: 1. Atherosclerotic disease within the aortic arch and major branch vessels of the neck, most notably as follows. 2. 50% stenosis of the proximal innominate artery. 3. Stenosis of the proximal right ICA estimated at up to 70%. 4. Apparent high-grade stenosis of the proximal left ICA. However, this stenosis may be overestimated due to blooming from calcified plaque with motion degradation at this level. Carotid artery duplex is recommended for further evaluation. 5. 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 The non dominant left vertebral artery is patent and enhances, suspected due to retrograde flow and with suspected  left subclavian steal. 6. The dominant right vertebral artery is patent within the neck without significant stenosis. CTA head: 1. 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 2. 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 3. No intracranial large vessel occlusion or proximal high-grade arterial stenosis. 4. Calcified plaque within the intracranial internal carotid arteries with no more than mild stenosis. 5. Moderate stenosis at the origin of a superior  division proximal M2 left MCA branch vessel. Electronically Signed   By: Jackey Loge DO   On: 01/14/2020 13:58   CT ANGIO NECK W OR WO CONTRAST  Result Date: 01/07/2020 CLINICAL DATA:  Stroke, follow-up. EXAM: CT ANGIOGRAPHY HEAD AND NECK TECHNIQUE: Multidetector CT imaging of the head and neck was performed using the standard protocol during bolus administration of intravenous contrast. Multiplanar CT image reconstructions and MIPs were obtained to evaluate the vascular anatomy. Carotid stenosis measurements (when applicable) are obtained utilizing NASCET criteria, using the distal internal carotid diameter as the denominator. CONTRAST:  Administered contrast not known at this time. COMPARISON:  No pertinent prior studies available for comparison. FINDINGS: CTA NECK FINDINGS Aortic arch: Standard aortic branching. Atherosclerotic plaque within the visualized aortic arch and proximal major branch vessels of the neck. Narrowing of proximal innominate artery of approximately 50%. 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 There is reconstitution of enhancement more distally within the left subclavian artery. Right carotid system: CCA and ICA patent within the neck. Calcified plaque within the carotid bifurcation and proximal ICA. Resultant stenosis of the proximal ICA estimated at up to 70%. There is  also mild atherosclerotic narrowing of the distal cervical ICA. Left carotid system: Atherosclerotic plaque at the origin of the CCA without hemodynamically significant stenosis (50% or greater). Scattered plaque within the remainder of the CCA without significant stenosis. There is calcified plaque within the carotid bifurcation and proximal ICA. There is apparent high-grade stenosis of the proximal ICA. However, this may be overestimated due to blooming of calcified plaque and motion artifact. Vertebral arteries: Nonstenotic calcified plaque at the origin of the non dominant right vertebral artery and scattered throughout the remainder of the vessel within the neck. Skeleton: No acute bony abnormality. Cervical spondylosis with multilevel disc space narrowing, posterior disc osteophytes, uncovertebral and facet hypertrophy. 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 Other neck: No neck mass or cervical lymphadenopathy. Upper chest: Pulmonary emphysema. No consolidation within the imaged lung apices. Review of the MIP images confirms the above findings CTA HEAD FINDINGS Anterior circulation: The intracranial internal carotid arteries are patent with calcified plaque but no more than mild stenosis. The M1 middle cerebral arteries are patent without significant stenosis. No M2 proximal branch occlusion is identified. Moderate stenosis at the origin of a superior division proximal M2 left MCA branch. The anterior cerebral arteries are patent without significant proximal stenosis. No anterior circulation aneurysm is identified Posterior circulation: The intracranial vertebral arteries are patent. 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 The basilar artery is patent without significant stenosis. The posterior cerebral arteries  are patent without significant proximal stenosis. Posterior communicating arteries are hypoplastic or absent bilaterally. Venous sinuses: Within limitations of contrast timing, no convincing thrombus. Anatomic variants: As described Review of the MIP images confirms the above findings These results were called by telephone at the time of interpretation on 01/04/2020 at 1:56 pm to provider PA Claudette Stapler, who verbally acknowledged these results. IMPRESSION: CTA neck: 1. Atherosclerotic disease within the aortic arch and major branch vessels of the neck, most notably as follows. 2. 50% stenosis of the proximal innominate artery. 3. Stenosis of the proximal right ICA estimated at up to 70%. 4. Apparent high-grade stenosis of the proximal left ICA. However, this stenosis may be  overestimated due to blooming from calcified plaque with motion degradation at this level. Carotid artery duplex is recommended for further evaluation. 5. 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 6. The dominant right vertebral artery is patent within the neck without significant stenosis. CTA head: 1. 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 2. 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 3. No intracranial large vessel occlusion or proximal high-grade arterial stenosis. 4. Calcified plaque within the intracranial internal carotid arteries with no more than mild stenosis. 5. Moderate stenosis at the origin of a superior division proximal M2 left MCA branch vessel. Electronically Signed   By: Jackey Loge DO   On: 2020-01-19 13:58   CT HEAD CODE STROKE WO CONTRAST  Addendum Date: January 19, 2020   ADDENDUM REPORT: 01-19-2020 13:31 ADDENDUM:  These results were called by telephone at the time of interpretation on 2020/01/19 at 1:31 pm to provider Claudette Stapler, who verbally acknowledged these results. Electronically Signed   By: Jackey Loge DO   On: 19-Jan-2020 13:31   Result Date: 01/19/20 CLINICAL DATA:  Code stroke.  Ataxia, stroke suspected EXAM: CT HEAD WITHOUT CONTRAST TECHNIQUE: Contiguous axial images were obtained from the base of the skull through the vertex without intravenous contrast. COMPARISON:  Noncontrast head CT 06/25/2021 FINDINGS: Brain: There is large volume acute subarachnoid hemorrhage greatest within the basal cisterns, prepontine cistern, posterior fossa, anterior interhemispheric fissure, MCA cisterns. There is a moderate to large amount of intraventricular hemorrhage greatest within the posterior lateral ventricles, within the third ventricle and within the fourth ventricle. There is mildly increased prominence of the lateral and third ventricles as compared to prior head CT 06/26/2019 suspicious for early hydrocephalus. Stable moderate generalized parenchymal atrophy. Stable, moderate/advanced patchy and confluent hypoattenuation within the cerebral white matter which is nonspecific, but consistent with chronic small vessel ischemic disease. No acute demarcated cortical infarct is identified. No extra-axial fluid collection. No evidence of intracranial mass. No midline shift. Redemonstrated left middle cranial fossa arachnoid cyst Vascular: No hyperdense vessel is appreciated. Atherosclerotic calcifications. Skull: Normal. Negative for fracture or focal lesion. Sinuses/Orbits: Visualized orbits show no acute finding. Small left maxillary sinus mucous retention cyst. Right mastoid effusion. IMPRESSION: Large volume acute subarachnoid hemorrhage greatest within the basal cisterns, posterior fossa, anterior interhemispheric fissure and MCA cisterns. There is moderate to large intraventricular hemorrhage greatest within the  posterior lateral ventricles, third ventricle and fourth ventricle. Findings are suspicious for aneurysmal subarachnoid hemorrhage. Stable generalized parenchymal atrophy and chronic small vessel ischemic disease. Electronically Signed: By: Jackey Loge DO On: 01-19-2020 13:26    Procedures .Critical Care Performed by: Derwood Kaplan, MD Authorized by: Derwood Kaplan, MD   Critical care provider statement:    Critical care time (minutes):  48   Critical care was necessary to treat or prevent imminent or life-threatening deterioration of the following conditions:  CNS failure or compromise   Critical care was time spent personally by me on the following activities:  Discussions with consultants, evaluation of patient's response to treatment, examination of patient, ordering and performing treatments and interventions, ordering and review of laboratory studies, ordering and review of radiographic studies, pulse oximetry, re-evaluation of patient's condition, obtaining history from patient or surrogate and review of old  charts   (including critical care time)  Medications Ordered in ED Medications  haloperidol (HALDOL) tablet 0.5 mg (has no administration in time range)    Or  haloperidol (HALDOL) 2 MG/ML solution 0.5 mg (has no administration in time range)    Or  haloperidol lactate (HALDOL) injection 0.5 mg (has no administration in time range)  ondansetron (ZOFRAN-ODT) disintegrating tablet 4 mg ( Oral See Alternative 01/14/2020 1446)    Or  ondansetron (ZOFRAN) injection 4 mg (4 mg Intravenous Given 01/06/2020 1446)  glycopyrrolate (ROBINUL) tablet 1 mg (has no administration in time range)    Or  glycopyrrolate (ROBINUL) injection 0.2 mg (has no administration in time range)    Or  glycopyrrolate (ROBINUL) injection 0.2 mg (has no administration in time range)  antiseptic oral rinse (BIOTENE) solution 15 mL (has no administration in time range)  polyvinyl alcohol (LIQUIFILM TEARS) 1.4 %  ophthalmic solution 1 drop (has no administration in time range)  morphine 4 MG/ML injection 4 mg (4 mg Intravenous Given 01/07/2020 1446)  iohexol (OMNIPAQUE) 350 MG/ML injection 75 mL (75 mLs Intravenous Contrast Given 01/01/2020 1332)    ED Course  I have reviewed the triage vital signs and the nursing notes.  Pertinent labs & imaging results that were available during my care of the patient were reviewed by me and considered in my medical decision making (see chart for details).  Clinical Course as of Jan 02 1535  Fri Jan 02, 2020  1340 Patient noted to have a large intracranial bleed. There is extension into the ventricles. I discussed this ominous finding with patient's daughter who is at the bedside now. We went over the nature of the bleed and the poor prognosis associated with it. Patient's CODE STATUS is DNR. Daughter reports that patient would not want heroic measures unless she was going to recover well.   CT HEAD CODE STROKE WO CONTRAST [AN]  1415 Case discussed with Dr. Venetia MaxonStern, neurosurgery. Case also discussed with palliative service. Dr. Venetia MaxonStern did confirm that patient would have poor prognosis and will not recover from this bleed to her normal functional status. I have discussed this finding with the family and they have given us permission to proceed with hospice placement. I have consulted hospice service and was advised that I will receive a call from Dr. Phillips OdorGolding when she is done with a family meeting.   [AN]    Clinical Course User Index [AN] Derwood KaplanNanavati, Gionni Vaca, MD   MDM Rules/Calculators/A&P                         86 year old female comes in a chief complaint of mental status change.  Apparently patient is normally more alert and active. Today she was found minimally responsive and with left-sided weakness. Code stroke was activated in the field and patient is found to have large intracranial bleed.  Anticipate poor prognosis. Will discuss with the family on next  steps. Hemodynamically she is stable.  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

## 2020-01-02 NOTE — Consult Note (Signed)
Neurology Consultation Reason for Consult: weakness Referring Physician: Rhunette Croft, A  CC: Left-sided weakness  History is obtained from: EMS  HPI: Leslie Sellers is a 86 y.o. female who normally sleeps ~1030 or 11, was last seen well around 7 PM when she went upstairs to bed.  Subsequently, when home health arrived to evaluation today they found her to be different than she typically is.  EMS was called, and due to her severe confusion and left-sided weakness code stroke was activated.  She was transported directly to CT where a large subarachnoid hemorrhage was demonstrated.   LKW: 7 PM tpa given?: no, ICH    ROS:  Unable to obtain due to altered mental status.   Past Medical History:  Diagnosis Date   Acute kidney injury (HCC)    Arthritis    CHF (congestive heart failure) (HCC)    COPD (chronic obstructive pulmonary disease) (HCC)    Hypertension      Family history: Unable to obtain due to altered mental status.   Social History:  reports that she has never smoked. She has never used smokeless tobacco. She reports that she does not drink alcohol and does not use drugs.   Exam: Current vital signs: There were no vitals filed for this visit. There is no height or weight on file to calculate BMI.   Vital signs in last 24 hours:     Physical Exam  Constitutional: Appears elderly Psych: Speech is not comprehensible Eyes: No scleral injection HENT: No OP obstrucion MSK: no joint deformities.  Cardiovascular: Normal rate and regular rhythm.  Respiratory: Effort normal, non-labored breathing GI: Soft.  No distension. There is no tenderness.  Skin: WDI  Neuro: Mental Status: Patient is lethargic but arousable, she follows commands but speech is not clearly understandable.   Cranial Nerves: II: She endorses being able to see finger was wiggling bilaterally.  Left pupil is 1 mm larger than right, both are reactive III,IV, VI: EOMI without ptosis or diploplia.   VII: Facial movement with left facial weakness Motor: She has a left hemiparesis  sensory: She response to noxious stimulation in all four extremities     I have reviewed labs in epic and the results pertinent to this consultation are: Creatinine 1.2  I have reviewed the images obtained: CT head massive subarachnoid hemorrhage  Impression: 86 year old female with massive subarachnoid hemorrhage.  I suspect that this is likely a nonsurvivable hemorrhage.  Given the age of the patient, and her moderately debilitated state even prior to this, I would have a discussion with family to see if proceeding with comfort care would be in accordance with patient's wishes.  Recommendations: 1) agree with proceeding to comfort care.   This patient is critically ill and at significant risk of neurological worsening, death and care requires constant monitoring of vital signs, hemodynamics,respiratory and cardiac monitoring, neurological assessment, discussion with family, other specialists and medical decision making of high complexity. I spent 35 minutes of neurocritical care time  in the care of  this patient. This was time spent independent of any time provided by nurse practitioner or PA.  Ritta Slot, MD [facility] [phone]  If 7pm- 7am, please page neurology on call as listed in AMION. 19-Jan-2020  5:38 PM

## 2020-01-02 NOTE — Progress Notes (Signed)
Civil engineer, contracting [facility]) Hospital Liaison note.   Received request from [facility] manager for family interest in [facility]. [facility] is unable to offer a room today. Hospital Liaison will follow up tomorrow or sooner if a room becomes available.   Please do not hesitate to call with questions.   Thank you for the opportunity to participate in this patient's care. Gillian Scarce, BSN, RN [facility] Liaison (listed on AMION under Hospice/[facility])    [phone]    (24h on call)

## 2020-01-03 DIAGNOSIS — I2510 Atherosclerotic heart disease of native coronary artery without angina pectoris: Secondary | ICD-10-CM | POA: Diagnosis present

## 2020-01-03 DIAGNOSIS — Z79899 Other long term (current) drug therapy: Secondary | ICD-10-CM | POA: Diagnosis not present

## 2020-01-03 DIAGNOSIS — Z7901 Long term (current) use of anticoagulants: Secondary | ICD-10-CM | POA: Diagnosis not present

## 2020-01-03 DIAGNOSIS — L8990 Pressure ulcer of unspecified site, unspecified stage: Secondary | ICD-10-CM | POA: Diagnosis present

## 2020-01-03 DIAGNOSIS — G8194 Hemiplegia, unspecified affecting left nondominant side: Secondary | ICD-10-CM | POA: Diagnosis present

## 2020-01-03 DIAGNOSIS — I509 Heart failure, unspecified: Secondary | ICD-10-CM | POA: Diagnosis present

## 2020-01-03 DIAGNOSIS — I613 Nontraumatic intracerebral hemorrhage in brain stem: Secondary | ICD-10-CM | POA: Diagnosis present

## 2020-01-03 DIAGNOSIS — Z87891 Personal history of nicotine dependence: Secondary | ICD-10-CM | POA: Diagnosis not present

## 2020-01-03 DIAGNOSIS — Z20822 Contact with and (suspected) exposure to covid-19: Secondary | ICD-10-CM | POA: Diagnosis present

## 2020-01-03 DIAGNOSIS — I615 Nontraumatic intracerebral hemorrhage, intraventricular: Secondary | ICD-10-CM | POA: Diagnosis present

## 2020-01-03 DIAGNOSIS — Z66 Do not resuscitate: Secondary | ICD-10-CM | POA: Diagnosis present

## 2020-01-03 DIAGNOSIS — Z515 Encounter for palliative care: Secondary | ICD-10-CM | POA: Diagnosis present

## 2020-01-03 DIAGNOSIS — J449 Chronic obstructive pulmonary disease, unspecified: Secondary | ICD-10-CM | POA: Diagnosis present

## 2020-01-03 DIAGNOSIS — I110 Hypertensive heart disease with heart failure: Secondary | ICD-10-CM | POA: Diagnosis present

## 2020-01-03 DIAGNOSIS — I607 Nontraumatic subarachnoid hemorrhage from unspecified intracranial artery: Secondary | ICD-10-CM | POA: Diagnosis present

## 2020-01-03 DIAGNOSIS — R4182 Altered mental status, unspecified: Secondary | ICD-10-CM | POA: Diagnosis present

## 2020-01-03 DIAGNOSIS — M1990 Unspecified osteoarthritis, unspecified site: Secondary | ICD-10-CM | POA: Diagnosis present

## 2020-01-03 MED ORDER — POLYVINYL ALCOHOL 1.4 % OP SOLN
1.0000 [drp] | Freq: Four times a day (QID) | OPHTHALMIC | Status: DC | PRN
  Filled 2020-01-03: qty 15

## 2020-01-03 MED ORDER — ACETAMINOPHEN 650 MG RE SUPP
650.0000 mg | Freq: Four times a day (QID) | RECTAL | Status: DC | PRN
  Administered 2020-01-03: 650 mg via RECTAL
  Filled 2020-01-03: qty 1

## 2020-01-03 MED ORDER — ORAL CARE MOUTH RINSE
15.0000 mL | Freq: Two times a day (BID) | OROMUCOSAL | Status: DC
  Administered 2020-01-03 (×2): 15 mL via OROMUCOSAL

## 2020-01-03 MED ORDER — ACETAMINOPHEN 325 MG PO TABS: 650.0000 mg | ORAL_TABLET | Freq: Four times a day (QID) | ORAL | Status: DC | PRN

## 2020-01-03 MED ORDER — BIOTENE DRY MOUTH MT LIQD: 15.0000 mL | OROMUCOSAL | Status: DC | PRN

## 2020-01-05 NOTE — Discharge Summary (Signed)
  Name: Leslie Sellers MRN: [id number] DOB: 1926-04-23 86 y.o.  Date of Admission: 2020/01/23  1:09 PM Date of Discharge: 01/05/2020 Attending Physician: Dr. Sandre Kitty  Discharge Diagnosis: Principal Problem:   Intracranial hemorrhage [facility]) Active Problems:   Comfort measures only status   ICH (intracerebral hemorrhage) (HCC)   Pressure injury of skin  Cause of death: Acute intracranial hemorrhage Time of death: 01/24/20 at Oct 26, 2202  Disposition and follow-up:   Ms.Kayren Yeatman was discharged from [facility] in expired condition.    Hospital Course: Ms. Needles is a 86 year old woman with medical history significant for COPD, congestive heart failure unspecified type, coronary artery disease, hypertension, GERD and anxiety disorder who presented to [facility] emergency department with altered mental status.  Her home health nurse had noticed that she appeared confused from her baseline and had also reported left-sided weakness when she arrived to check on her this morning.  The nursing aide had also reported she was lethargic.  Apparently she usually sleeps around 1030 or 11 PM and her last known well was around 7 PM yesterday when she went upstairs to sleep.  On arrival to the ED, a code stroke was called and CT head had revealed significantly large volume acute subarachnoid hemorrhage at the basal cisterns, posterior fossa, anterior interhemispheric fissure and MCA cisterns.  There was also moderate to large intraventricular hemorrhage greatest within the posterior lateral ventricles, third ventricle and fourth ventricle.  On my initial assessment, she was alert and oriented x0, only spoke minimally and withdrew to painful stimuli.  Neurological examination was difficult to assess due to her altered mental status. Neurology and neurosurgery were both consulted in the emergency department and had ongoing discussion with family due to the grave nature and nonsurvivable nature of [name]  condition.  After much discussion, the family had elected to proceed to comfort care for which palliative medicine ([facility]) was consulted.  Comfort care orders were placed and patient expired on 01-24-2020 at 10/26/2202.  Signed: Yvette Rack, MD 01/05/2020, 7:19 AM

## 2020-01-25 NOTE — Care Management (Signed)
Notified by [facility] liaison that [facility] does not have any beds today.

## 2020-01-25 NOTE — Progress Notes (Signed)
Palliative Brief Progress Note:  PMT consult received and chart reviewed.   Goals were clear - family wanted residential hospice services. Comfort care orders were placed, [facility] is aware, and patient is waiting for bed at [facility].   PMT has no further recommendations at this time.  Please call if further assistance can be provided.  Thank you for consulting PMT.  NO CHARGE  [facility] M. Saragrace Selke FNP-BC

## 2020-01-25 NOTE — Progress Notes (Signed)
Civil engineer, contracting [facility]) Hospital Liaison note.    Unfortunately [facility] is unable to offer a room today. Daughter Dixie made aware. TOC Debbie aware.  Hospital Liaison will follow up tomorrow or sooner if a room becomes available.    Please do not hesitate to call with questions.    Thank you for the opportunity to participate in this patient's care.  Chrislyn Brooke Dare, BSN, RN [facility] Liaison (listed on AMION under Hospice/[facility])    [phone]  (24h on call)

## 2020-01-25 NOTE — Progress Notes (Signed)
[facility] notified, Referral number: [id number]. Spoke with Jackolyn Confer.

## 2020-01-25 NOTE — Progress Notes (Addendum)
   Subjective: Patient is a 86 yo with PMH of COPD, CHF and HTN, presenting to the hospital for altered mental status and left sided weakness. CT head shows non-survivable subarachnoid hemorrhage and intraventricular bleeding. Family seek comfort care. Awaiting placement to [facility]. Patient is examined at bedside. She is resting comfortably. She is somnolent and minimally responsive to verbal stimulation.   Objective:  Vital signs in last 24 hours: Vitals:   2020-01-11 1807 01-11-2020 2024 01/11/2020 2028 Jan 11, 2020 2114  BP: 111/87  118/83 106/78  Pulse: 72 75 78 77  Resp: (!) 27 (!) 24 16 17   Temp:   98.3 F (36.8 C) 98.4 F (36.9 C)  TempSrc:   Axillary Axillary  SpO2: 97% 99% 98% 98%  Weight:    50.7 kg  Height:    5' (1.524 m)    Physical Exam  Physical Exam Constitutional:      General: She is not in acute distress.    Comments: Resting comfortably. In no acute distress. Minimally responsive to verbal stimulation  HENT:     Head: Normocephalic.  Cardiovascular:     Rate and Rhythm: Normal rate and regular rhythm.     Heart sounds: Normal heart sounds.  Pulmonary:     Effort: Pulmonary effort is normal. No respiratory distress.  Abdominal:     General: Bowel sounds are normal.     Tenderness: There is no abdominal tenderness.  Musculoskeletal:        General: No deformity.     Right lower leg: No edema.     Left lower leg: No edema.  Neurological:     Mental Status: She is disoriented.     Assessment/Plan: Leslie Sellers is a 86 y.o. female with PMH of COPD, CHF and HTN, presenting to the hospital for altered mental status and left sided weakness, found to have a non-survivable subarachnoid hemorrhage. Follow comfort care measures.   Principal Problem:   Intracranial hemorrhage (HCC) Active Problems:   Comfort measures only status   ICH (intracerebral hemorrhage) (HCC)   Pressure injury of skin  Intracranial hemorrhage Patient present to the hospital for  altered mental status and left sided weakness. CT head shows a non-survivable subarachnoid hemorrhage. Patient family denies any fall or trauma. 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 Patient is DNR. Family seek comfort care measure. Palliative care has been consulted and plan to discharge to the [facility] place.  -Continue comfort measures with antiseptic oral rinse, Robinul, Haldol, morphine, Zofran - Anticipated survival less than 2 weeks - D/c home meds - Supplement oxygen for comfort - Bladder scan > 400 cc. Foley placed.  - Awaiting available beds at the [facility] place.    Diet: N/A IVF: N/A VTE: SCD CODE: DNR  Prior to Admission Living Arrangement: home Anticipated Discharge Location: [facility] Barriers to Discharge: hospice home placement Dispo: Anticipated discharge in approximately 1-2 day(s).   [facility], DO 01/19/2020, 7:59 AM Pager: [phone] After 5pm on weekdays and 1pm on weekends: On Call pager [phone]

## 2020-01-25 NOTE — Progress Notes (Addendum)
Pt found without pulse or RR at 2204; CN in to verify TOD. On call provider notified, family called and message left, [place] donor services to be notified as well.

## 2020-01-25 DEATH — deceased

## 2021-02-08 IMAGING — CT CT ABD-PELV W/O CM
2 of 4 series · 16 of 46 positions shown, 18 images · non-contrast
Comparison: None.

CLINICAL DATA: Nausea and vomiting.

EXAM:
CT ABDOMEN AND PELVIS WITHOUT CONTRAST
TECHNIQUE: Multidetector CT imaging of the abdomen and pelvis was performed
following the standard protocol without IV contrast.

[Series 3: a/p w/o 5mm · axial · non-contrast · 0.84mm/px · z∈[+1072,+1442]mm · 13 of 82 slices shown, 15 images]
[im 4/82  soft-tissue]
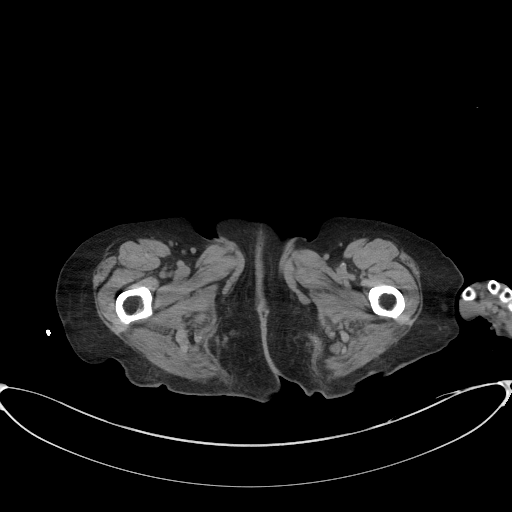
[im 4/82  bone]
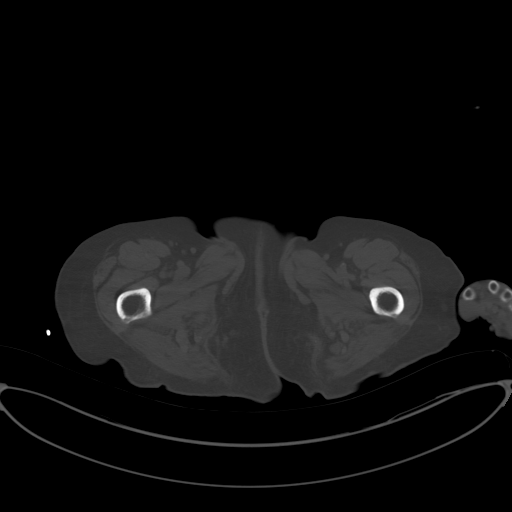
[im 11/82  soft-tissue]
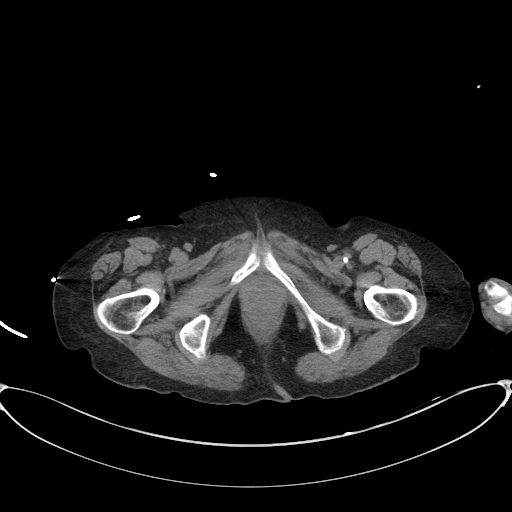
[im 17/82  soft-tissue]
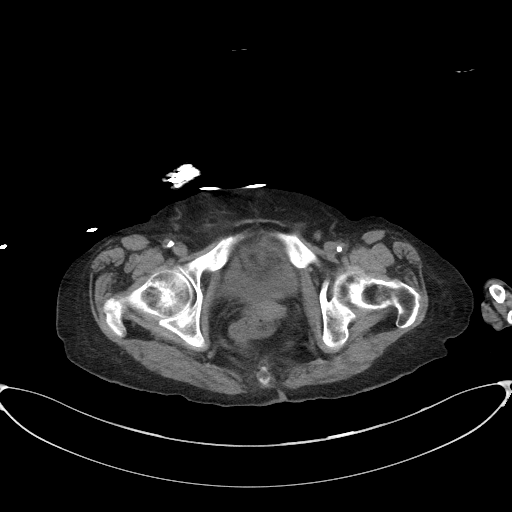
[im 24/82  soft-tissue]
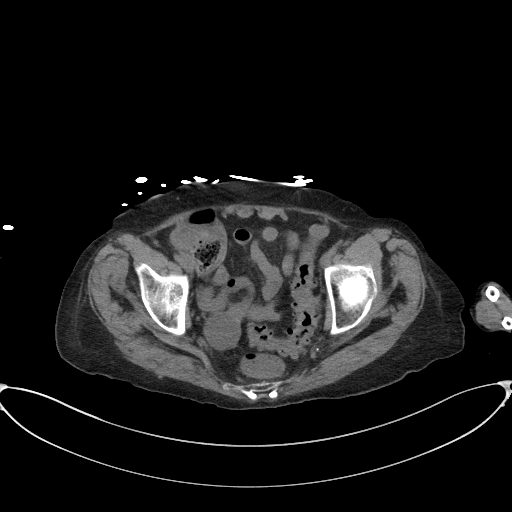
[im 28/82  soft-tissue]
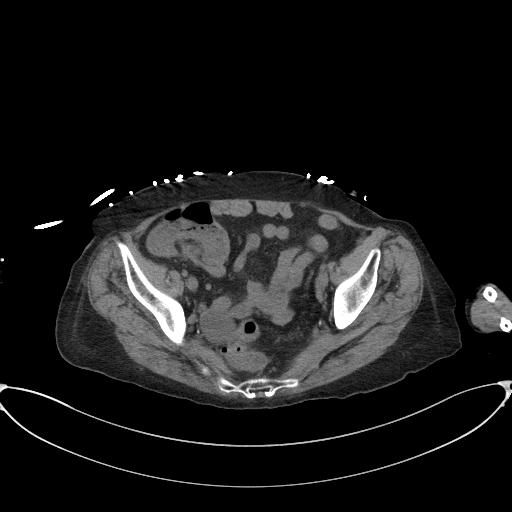
[im 34/82  soft-tissue]
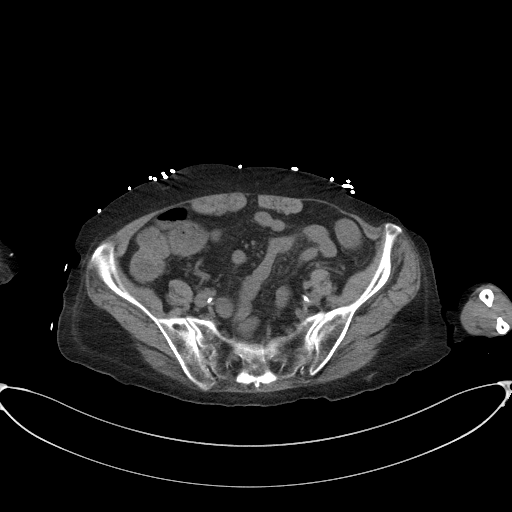
[im 41/82  soft-tissue]
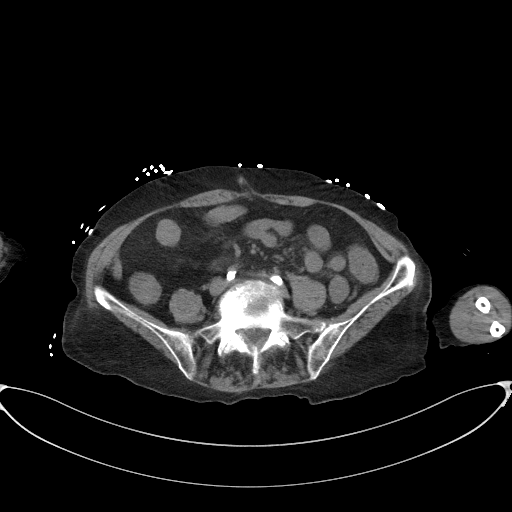
[im 48/82  soft-tissue]
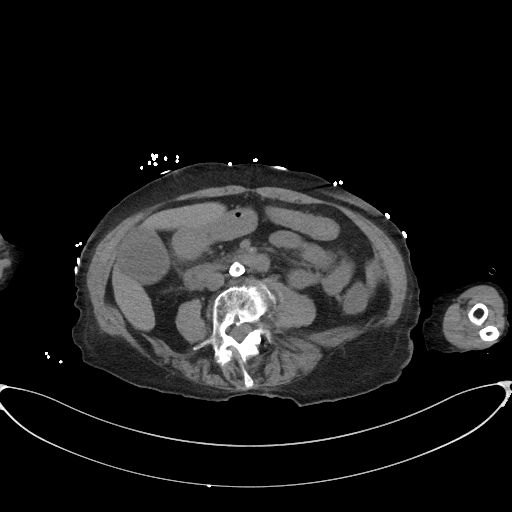
[im 55/82  soft-tissue]
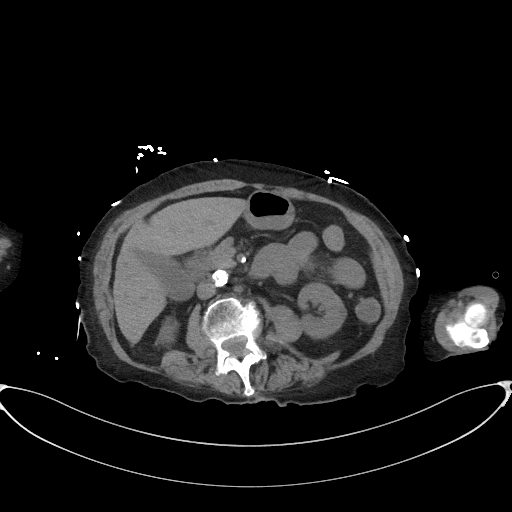
[im 55/82  bone]
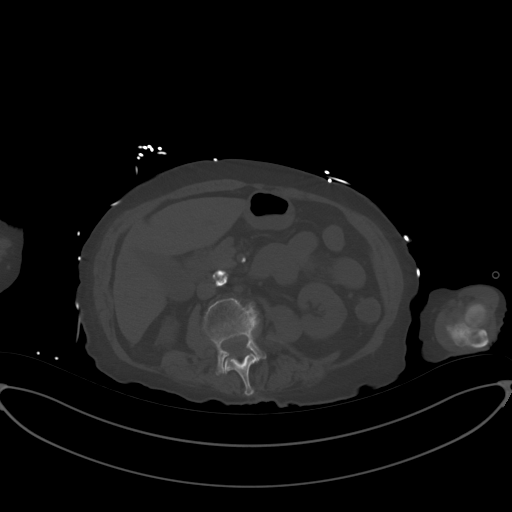
[im 58/82  soft-tissue]
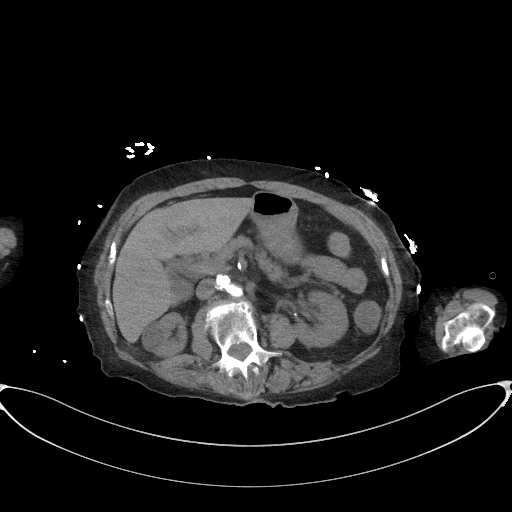
[im 65/82  soft-tissue]
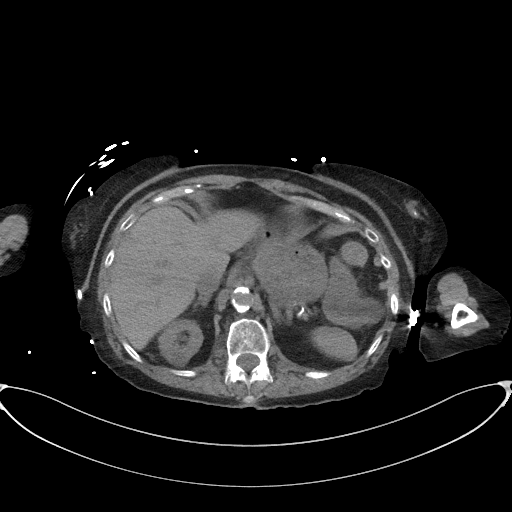
[im 71/82  soft-tissue]
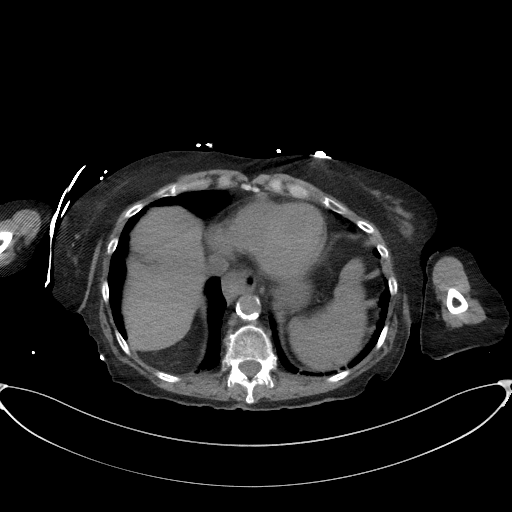
[im 78/82  soft-tissue]
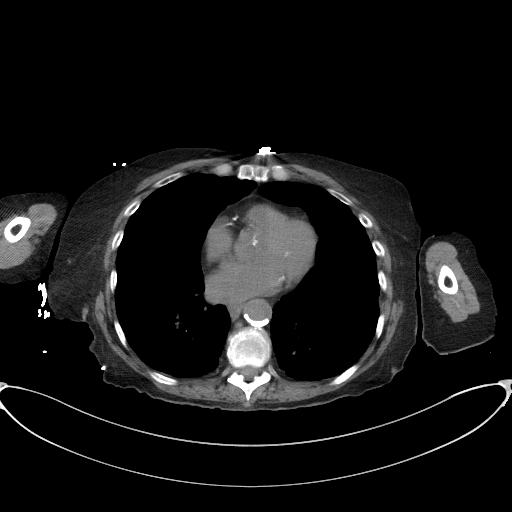

[Series 6: a/p w/o cor · coronal · non-contrast · 0.76mm/px · 3 of 122 slices shown]
[im 41/122  soft-tissue]
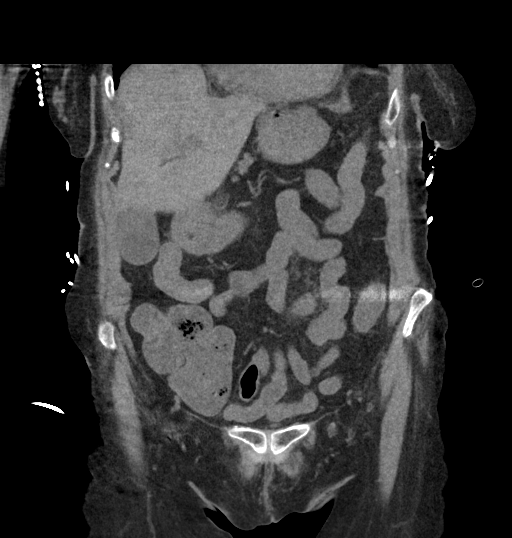
[im 54/122  soft-tissue]
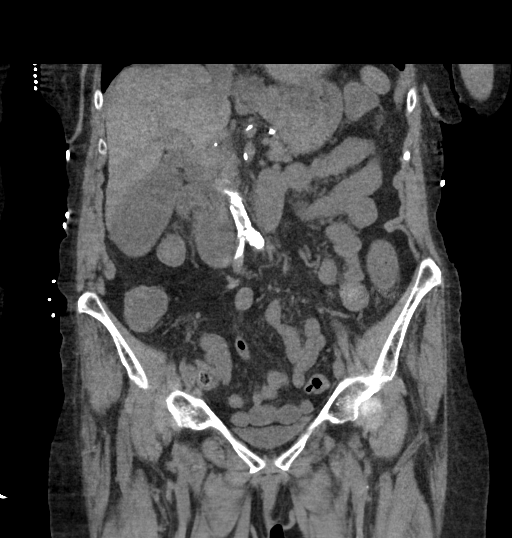
[im 68/122  soft-tissue]
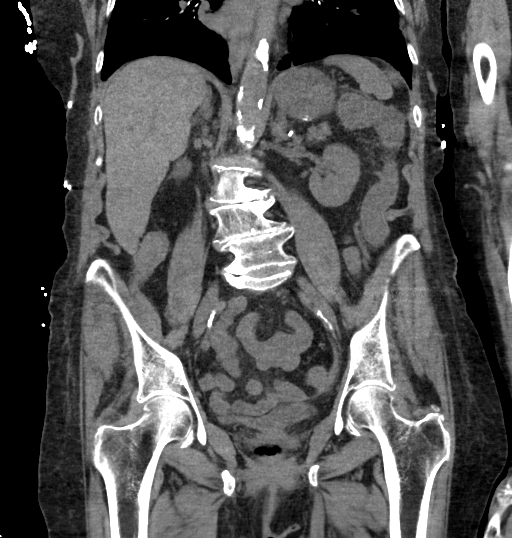

[16 of 46 positions shown; findings below may reference images not displayed]

FINDINGS: Lower chest: There are emphysematous changes at the lung bases
bilaterally with prominent pleuroparenchymal scarring and reticular
airspace opacities.The heart size is normal.

Hepatobiliary: The liver is normal. The gallbladder is distended.
There is gallbladder wall thickening.There is no biliary ductal
dilation.

Pancreas: Normal contours without ductal dilatation. No
peripancreatic fluid collection.

Spleen: No splenic laceration or hematoma.

Adrenals/Urinary Tract:

--Adrenal glands: No adrenal hemorrhage.

--Right kidney/ureter: No hydronephrosis or perinephric hematoma.

--Left kidney/ureter: No hydronephrosis or perinephric hematoma.

--Urinary bladder: The bladder is decompressed and therefore poorly
evaluated

Stomach/Bowel:

--Stomach/Duodenum: There is a small hiatal hernia.

--Small bowel: No dilatation or inflammation.

--Colon: There is mild diffuse wall thickening involving the
transverse colon and descending colon. There is sigmoid
diverticulosis without CT evidence for diverticulitis.

--Appendix: Normal.

Vascular/Lymphatic: There are extensive atherosclerotic changes
throughout the abdominal aorta. There may be significant stenosis
involving the infrarenal abdominal aorta. An underlying occlusion
cannot be excluded. There is likely high-grade stenosis if not
occlusion of the bilateral common iliac arteries.

--No retroperitoneal lymphadenopathy.

--No mesenteric lymphadenopathy.

--No pelvic or inguinal lymphadenopathy.

Reproductive: There is a right ovarian cystic mass measuring
approximately 3.7 cm in diameter.

Other: No ascites or free air. The abdominal wall is normal.

Musculoskeletal. No acute displaced fractures.
IMPRESSION: 1. The gallbladder is distended and there is gallbladder wall
thickening. If there is concern for acute cholecystitis, recommend
further evaluation with right upper quadrant ultrasound.
2. Mild diffuse wall thickening of the transverse and descending
colon may represent infectious or inflammatory colitis in the
appropriate clinical setting.
3. Extensive atherosclerotic changes of the infrarenal abdominal
aorta and bilateral common iliac arteries. An underlying stenosis of
both is suspected and can be further evaluated with a nonemergent
outpatient CT a as clinically indicated.
4. There is sigmoid diverticulosis without CT evidence for
diverticulitis.
5. There is a 3.7 cm right ovarian cystic mass. Follow-up with a
nonemergent outpatient pelvic ultrasound is recommended in 6 months.

Aortic Atherosclerosis (NRLLF-XAA.A) and Emphysema (NRLLF-OI4.5).
